# Patient Record
Sex: Male | Born: 1990 | Race: White | Hispanic: No | Smoking: Never smoker
Health system: Southern US, Community
[De-identification: ages and names within clinical notes are randomized; demographics above are authoritative.]

## PROBLEM LIST (undated history)

## (undated) DIAGNOSIS — G4730 Sleep apnea, unspecified: Secondary | ICD-10-CM

## (undated) DIAGNOSIS — K8071 Calculus of gallbladder and bile duct without cholecystitis with obstruction: Secondary | ICD-10-CM

## (undated) DIAGNOSIS — K37 Unspecified appendicitis: Secondary | ICD-10-CM

## (undated) DIAGNOSIS — R7989 Other specified abnormal findings of blood chemistry: Secondary | ICD-10-CM

## (undated) HISTORY — DX: Other specified abnormal findings of blood chemistry: R79.89

## (undated) HISTORY — PX: APPENDECTOMY: SHX54

## (undated) HISTORY — DX: Calculus of gallbladder and bile duct without cholecystitis with obstruction: K80.71

## (undated) HISTORY — PX: CHOLECYSTECTOMY: SHX55

---

## 2008-02-06 DIAGNOSIS — K8071 Calculus of gallbladder and bile duct without cholecystitis with obstruction: Secondary | ICD-10-CM

## 2008-02-06 HISTORY — PX: GALLBLADDER SURGERY: SHX652

## 2008-02-06 HISTORY — DX: Calculus of gallbladder and bile duct without cholecystitis with obstruction: K80.71

## 2014-05-13 ENCOUNTER — Ambulatory Visit (INDEPENDENT_AMBULATORY_CARE_PROVIDER_SITE_OTHER): Payer: BC Managed Care – PPO | Admitting: Physician Assistant

## 2014-05-13 ENCOUNTER — Encounter: Payer: Self-pay | Admitting: Physician Assistant

## 2014-05-13 VITALS — BP 126/96 | HR 97 | Temp 98.3°F | Resp 16 | Ht 72.5 in | Wt >= 6400 oz

## 2014-05-13 DIAGNOSIS — F32A Depression, unspecified: Secondary | ICD-10-CM

## 2014-05-13 DIAGNOSIS — H6981 Other specified disorders of Eustachian tube, right ear: Secondary | ICD-10-CM

## 2014-05-13 DIAGNOSIS — F419 Anxiety disorder, unspecified: Principal | ICD-10-CM

## 2014-05-13 DIAGNOSIS — H6980 Other specified disorders of Eustachian tube, unspecified ear: Secondary | ICD-10-CM

## 2014-05-13 DIAGNOSIS — N62 Hypertrophy of breast: Secondary | ICD-10-CM

## 2014-05-13 DIAGNOSIS — F329 Major depressive disorder, single episode, unspecified: Secondary | ICD-10-CM

## 2014-05-13 DIAGNOSIS — F341 Dysthymic disorder: Secondary | ICD-10-CM

## 2014-05-13 DIAGNOSIS — H6990 Unspecified Eustachian tube disorder, unspecified ear: Secondary | ICD-10-CM

## 2014-05-13 DIAGNOSIS — E291 Testicular hypofunction: Secondary | ICD-10-CM

## 2014-05-13 LAB — COMPREHENSIVE METABOLIC PANEL
ALBUMIN: 4.2 g/dL (ref 3.5–5.2)
ALK PHOS: 83 U/L (ref 39–117)
ALT: 39 U/L (ref 0–53)
AST: 25 U/L (ref 0–37)
BILIRUBIN TOTAL: 0.5 mg/dL (ref 0.2–1.2)
BUN: 13 mg/dL (ref 6–23)
CO2: 24 mEq/L (ref 19–32)
Calcium: 9.3 mg/dL (ref 8.4–10.5)
Chloride: 103 mEq/L (ref 96–112)
Creat: 0.92 mg/dL (ref 0.50–1.35)
GLUCOSE: 90 mg/dL (ref 70–99)
POTASSIUM: 4.6 meq/L (ref 3.5–5.3)
Sodium: 137 mEq/L (ref 135–145)
Total Protein: 7 g/dL (ref 6.0–8.3)

## 2014-05-13 LAB — TSH: TSH: 1.384 u[IU]/mL (ref 0.350–4.500)

## 2014-05-13 LAB — T4: T4, Total: 9 ug/dL (ref 5.0–12.5)

## 2014-05-13 MED ORDER — ESCITALOPRAM OXALATE 10 MG PO TABS: ORAL_TABLET | ORAL | Status: DC

## 2014-05-13 MED ORDER — FLUTICASONE PROPIONATE 50 MCG/ACT NA SUSP: 2.0000 | Freq: Every day | NASAL | Status: DC

## 2014-05-13 NOTE — Patient Instructions (Signed)
Please obtain labs.  I will call you with your results.  We will know more once we have labs. For anxiety and mood, please start taking Lexapro as instructed. Follow-up with me for this in 4-6 weeks.  If you develop any stomach upset or erectile dysfunction and this does not go away within a few days, please call the office.

## 2014-05-13 NOTE — Progress Notes (Signed)
Patient presents to clinic today to establish care.  Acute Concerns: Patient c/o pressure and popping sound in his right ear over the past 2 months.  Denies ear pain.  Endorses occasional rhinorrhea and seasonal allergy symptoms.  Denies tinnitus, decreased hearing or drainage from ear.  Patient also wishes to discuss gynecomastia.  Patient states that he was diagnosed with gynecomastia and low testosterone when he was in his late teens.  Was sent to a surgeon who did a workup.  Patient states he never received surgical or other treatment for symptoms.  Body mass index is 54.98 kg/(m^2).  Patient also states he has been having difficulty losing weight.  Is trying to increase daily exercise and to eat healthier.  Endorses mental sluggishness and loss of libido.  Denies hx of thyroid disorder, although he states this runs in his family.    Patient also c/o depressed mood and anxiety stemming from his self-image.  States this has been going on for several years.  Impacts quality of life. Denies panic attacks.  Denies suicidal ideation.   Chronic Issues: See Above concerning gynecomastia and hypogonadism.  Health Maintenance: Dental -- overdue  Vision -- overdue; has upcoming appointment.  Immunizations -- Tetanus 04/29/2014  Past Medical History  Diagnosis Date  . Gallbladder & bile duct stone with obstruction 05.2009    Past Surgical History  Procedure Laterality Date  . Gallbladder surgery  05.2009    No current outpatient prescriptions on file prior to visit.   No current facility-administered medications on file prior to visit.    Allergies  Allergen Reactions  . Morphine And Related     Burning in veins    Family History  Problem Relation Age of Onset  . Depression Mother     Living  . Hypertension Maternal Grandfather   . Stroke Maternal Grandfather   . Diverticulosis Father   . Ovarian cancer Paternal Grandmother   . Bipolar disorder Sister   . Cancer Sister    Deceased at Age 16  . Bipolar disorder Maternal Grandmother     History   Social History  . Marital Status: Single    Spouse Name: N/A    Number of Children: N/A  . Years of Education: N/A   Occupational History  . Not on file.   Social History Main Topics  . Smoking status: Never Smoker   . Smokeless tobacco: Never Used  . Alcohol Use: Yes     Comment: rare  . Drug Use: No  . Sexual Activity: Yes     Comment: women -- OCPs   Other Topics Concern  . Not on file   Social History Narrative  . No narrative on file   ROS See HPI.  All other ROS are negative.  BP 126/96  Pulse 97  Temp(Src) 98.3 F (36.8 C) (Oral)  Resp 16  Ht 6' 0.5" (1.842 m)  Wt 411 lb 4 oz (186.542 kg)  BMI 54.98 kg/m2  SpO2 97%  Physical Exam  Vitals reviewed. Constitutional: He is oriented to person, place, and time and well-developed, well-nourished, and in no distress.  HENT:  Head: Normocephalic and atraumatic.  Right Ear: External ear normal.  Left Ear: External ear normal.  Nose: Nose normal.  Mouth/Throat: Oropharynx is clear and moist. No oropharyngeal exudate.  Eyes: Conjunctivae are normal.  Neck: Neck supple. No thyromegaly present.  Cardiovascular: Normal rate, regular rhythm, normal heart sounds and intact distal pulses.   Pulmonary/Chest: Effort normal and breath sounds normal.  No respiratory distress. He has no wheezes. He has no rales. He exhibits no tenderness.  Bilateral gynecomastia noted.  Abdominal: Soft. Bowel sounds are normal. He exhibits no distension. There is no tenderness.  Lymphadenopathy:    He has no cervical adenopathy.  Neurological: He is alert and oriented to person, place, and time.  Skin: Skin is warm and dry. No rash noted.  Psychiatric: Affect normal.   Assessment/Plan: Gynecomastia Unclear etiology.  Patient endorses hypogonadism as cause.  Never had treatment. Body mass index is 54.98 kg/(m^2).  Denies marijuana use.  Is not on any medications  linked to symptom. Excess adipose tissue is likely contributing.  Will obtain workup to include Testosterone and Estrogen levels.  Anxiety and depression Will check thyroid function panel.  Low T could be contributing.  Definitely a body-image aspect to symptoms. Rx Lexapro.  Handout given on [employer] counselors.  Encouraged continue TLCs for weight loss.  Handout given on calorie counting and exercise. Follow-up in 1 month.  Eustachian tube dysfunction Daily Claritin.  Rx Flonase daily.   Hypogonadism in male Endorses by patient.  Unclear whether primary or secondary.  Will obtain records from previous PMD. Will obtain Testosterone, Estrogen, FSH and LH.  Will also obtain TSH and CMP. Will further evaluate and workup based on initial results.  Morbid obesity Discussed diet and exercise. Will check thyroid function. Handouts given.  Follow-up in 1 month.

## 2014-05-13 NOTE — Progress Notes (Signed)
Pre visit review using our clinic review tool, if applicable. No additional management support is needed unless otherwise documented below in the visit note/SLS  

## 2014-05-14 LAB — TESTOSTERONE, FREE, TOTAL, SHBG
Sex Hormone Binding: 20 nmol/L (ref 13–71)
TESTOSTERONE-% FREE: 2.5 % (ref 1.6–2.9)
TESTOSTERONE: 209 ng/dL — AB (ref 300–890)
Testosterone, Free: 51.7 pg/mL (ref 47.0–244.0)

## 2014-05-17 LAB — ESTROGENS, TOTAL: Estrogen: 189 pg/mL (ref 60–190)

## 2014-05-19 ENCOUNTER — Telehealth: Payer: Self-pay | Admitting: *Deleted

## 2014-05-19 DIAGNOSIS — R7989 Other specified abnormal findings of blood chemistry: Secondary | ICD-10-CM

## 2014-05-19 NOTE — Telephone Encounter (Signed)
Message copied by Kathi SimpersFERGERSON, Jocee Kissick A on Wed May 19, 2014  3:47 PM ------      Message from: Marcelline MatesMARTIN, WILLIAM      Created: Tue May 18, 2014  8:50 AM       Estrogen level is normal.  For the low testosterone, the level has to be repeated to confirm diagnosis (for insurance purposes) for insurance to pay for treatment.  We would either need to repeat the level in our lab, or we can wait for the records from his previous PCP which should have results from his prior workup of gynecomastia. We do need to distinguish whether or not the testosterone deficiency is stemming from his testicles (low testosterone output) or from his pituitary gland in the brain (decreased signaling to the testes for testosterone production).  I imagine this was done in his previous workup so we can either obtain these labs in our office [facility](LH, FSH, free/total testosterone) or wait until his records are in.  Once we have these values, we can start the right therapy -- likely testosterone replacement. Please let me know what patient wishes to do. ------

## 2014-05-19 NOTE — Telephone Encounter (Signed)
Notified pt of below recommendations. Pt wishes to complete additional testing in our lab and will return tomorrow to have these completed. Lab orders entered.

## 2014-05-20 ENCOUNTER — Telehealth: Payer: Self-pay | Admitting: Physician Assistant

## 2014-05-20 DIAGNOSIS — F329 Major depressive disorder, single episode, unspecified: Secondary | ICD-10-CM | POA: Insufficient documentation

## 2014-05-20 DIAGNOSIS — F32A Depression, unspecified: Secondary | ICD-10-CM | POA: Insufficient documentation

## 2014-05-20 DIAGNOSIS — E291 Testicular hypofunction: Secondary | ICD-10-CM

## 2014-05-20 DIAGNOSIS — F419 Anxiety disorder, unspecified: Principal | ICD-10-CM

## 2014-05-20 DIAGNOSIS — N62 Hypertrophy of breast: Secondary | ICD-10-CM

## 2014-05-20 DIAGNOSIS — H6990 Unspecified Eustachian tube disorder, unspecified ear: Secondary | ICD-10-CM

## 2014-05-20 DIAGNOSIS — H6980 Other specified disorders of Eustachian tube, unspecified ear: Secondary | ICD-10-CM | POA: Insufficient documentation

## 2014-05-20 HISTORY — DX: Hypertrophy of breast: N62

## 2014-05-20 HISTORY — DX: Unspecified eustachian tube disorder, unspecified ear: H69.90

## 2014-05-20 LAB — LUTEINIZING HORMONE: LH: 2 m[IU]/mL (ref 1.5–9.3)

## 2014-05-20 LAB — FOLLICLE STIMULATING HORMONE: FSH: 0.6 m[IU]/mL — ABNORMAL LOW (ref 1.4–18.1)

## 2014-05-20 NOTE — Assessment & Plan Note (Signed)
Discussed diet and exercise. Will check thyroid function. Handouts given.  Follow-up in 1 month.

## 2014-05-20 NOTE — Assessment & Plan Note (Addendum)
Will check thyroid function panel.  Low T could be contributing.  Definitely a body-image aspect to symptoms. Rx Lexapro.  Handout given on [employer] counselors.  Encouraged continue TLCs for weight loss.  Handout given on calorie counting and exercise. Follow-up in 1 month.

## 2014-05-20 NOTE — Assessment & Plan Note (Signed)
Daily Claritin.  Rx Flonase daily.

## 2014-05-20 NOTE — Telephone Encounter (Signed)
Results are in -- Testosterone level is still reduced.  His FSH is decreased and LH is at the low end of normal.  This makes me concerned that the testicles are not producing enough testosterone as a result of lack of hormone-signaling from his pituitary gland.  This could be due to a benign hormone secreting tumor called a prolactinoma. Low T may not be due to a testicular problem itself.  The next phase of evaluation would be to check a prolactin level and an MRI of the brain to assess his pituitary gland for a hormone-secreting tumor. We need to figure out the source of this deficiency before we decide on definitive treatment.  I am also willing to set him up with an Endocrinologist for a second opinion before proceeding with imaging if he wishes, but this is the standard course of workup for his hypogonadism.

## 2014-05-20 NOTE — Assessment & Plan Note (Signed)
Endorses by patient.  Unclear whether primary or secondary.  Will obtain records from previous PMD. Will obtain Testosterone, Estrogen, FSH and LH.  Will also obtain TSH and CMP. Will further evaluate and workup based on initial results.

## 2014-05-20 NOTE — Assessment & Plan Note (Signed)
Unclear etiology.  Patient endorses hypogonadism as cause.  Never had treatment. Body mass index is 54.98 kg/(m^2).  Denies marijuana use.  Is not on any medications linked to symptom. Excess adipose tissue is likely contributing.  Will obtain workup to include Testosterone and Estrogen levels.

## 2014-05-21 ENCOUNTER — Other Ambulatory Visit: Payer: Self-pay | Admitting: Physician Assistant

## 2014-05-21 DIAGNOSIS — E291 Testicular hypofunction: Secondary | ICD-10-CM

## 2014-05-21 LAB — TESTOSTERONE, FREE, TOTAL, SHBG
Sex Hormone Binding: 19 nmol/L (ref 13–71)
TESTOSTERONE: 171 ng/dL — AB (ref 300–890)
Testosterone, Free: 42.7 pg/mL — ABNORMAL LOW (ref 47.0–244.0)
Testosterone-% Free: 2.5 % (ref 1.6–2.9)

## 2014-05-21 NOTE — Telephone Encounter (Signed)
Attempted to contact patient at 9:34 AM on 8/14 but could not reach him.  Voicemail not set up.  Will attempt to reach again today at lunchtime.

## 2014-05-21 NOTE — Telephone Encounter (Signed)
Patient called back.  Spoke with patient concerning results.  He wishes to proceed with MRI and further labs.  [facility]

## 2014-05-22 ENCOUNTER — Ambulatory Visit (HOSPITAL_BASED_OUTPATIENT_CLINIC_OR_DEPARTMENT_OTHER): Payer: BC Managed Care – PPO

## 2014-05-22 ENCOUNTER — Ambulatory Visit (HOSPITAL_BASED_OUTPATIENT_CLINIC_OR_DEPARTMENT_OTHER): Admission: RE | Admit: 2014-05-22 | Payer: BC Managed Care – PPO | Source: Ambulatory Visit

## 2014-05-24 ENCOUNTER — Telehealth: Payer: Self-pay | Admitting: Physician Assistant

## 2014-05-24 ENCOUNTER — Other Ambulatory Visit: Payer: Self-pay | Admitting: Physician Assistant

## 2014-05-24 DIAGNOSIS — E291 Testicular hypofunction: Secondary | ICD-10-CM

## 2014-05-24 NOTE — Telephone Encounter (Signed)
Spoke with patient who wanted to discuss the workup again.  Has been reading on the internet and had some questions.  Questions answered.

## 2014-05-24 NOTE — Telephone Encounter (Signed)
Pt is still wanting to go ahead with MRI, I will get it authorized. [facility]

## 2014-05-25 LAB — PROLACTIN: Prolactin: 9.7 ng/mL (ref 2.1–17.1)

## 2014-05-26 ENCOUNTER — Ambulatory Visit (INDEPENDENT_AMBULATORY_CARE_PROVIDER_SITE_OTHER): Payer: BC Managed Care – PPO

## 2014-05-26 ENCOUNTER — Other Ambulatory Visit: Payer: BC Managed Care – PPO

## 2014-05-26 ENCOUNTER — Telehealth: Payer: Self-pay | Admitting: Physician Assistant

## 2014-05-26 DIAGNOSIS — E236 Other disorders of pituitary gland: Secondary | ICD-10-CM

## 2014-05-26 DIAGNOSIS — E291 Testicular hypofunction: Secondary | ICD-10-CM

## 2014-05-26 MED ORDER — GADOBENATE DIMEGLUMINE 529 MG/ML IV SOLN: 20.0000 mL | Freq: Once | INTRAVENOUS | Status: AC | PRN

## 2014-05-26 NOTE — Telephone Encounter (Signed)
MRI is negative for mass or lesion which is a good finding.  His Prolactin levels were normal.  At this point, since we have ruled out a pituitary cause of symptoms, we can initiate testosterone replacement therapy.  There are different ways to administer testosterone.  The most common is through a gel placed on the shoulders.  The other types are wither through skin patches, oral losenges or injections.  If he is willing, I would like to start him on the gel and have him come back in 1 month to reassess how he is feeling and his testosterone levels.  I also recommend increased exercise and low cholesterol diet.  He is not to begin smoking while on testosterone therapy as even though he is young, it increases risk of stroke and heart attack.

## 2014-05-28 MED ORDER — TESTOSTERONE 50 MG/5GM (1%) TD GEL: 5.0000 g | Freq: Every day | TRANSDERMAL | Status: DC

## 2014-05-28 NOTE — Telephone Encounter (Signed)
Rx sent to pharmacy   

## 2014-05-28 NOTE — Telephone Encounter (Signed)
Patient informed, understood & agreed; Ok to prooceed forward with Testosterone Gel/SLS

## 2014-06-02 ENCOUNTER — Telehealth: Payer: Self-pay | Admitting: *Deleted

## 2014-06-02 ENCOUNTER — Telehealth: Payer: Self-pay

## 2014-06-02 MED ORDER — TESTOSTERONE 50 MG/5GM (1%) TD GEL: 5.0000 g | Freq: Every day | TRANSDERMAL | Status: DC

## 2014-06-02 NOTE — Telephone Encounter (Signed)
Per Vo provider via telephone, Ok to change Rx to [employer] received], Rx sent to pharmacy; Patient informed, understood & agreed/SLS

## 2014-06-02 NOTE — Telephone Encounter (Signed)
Representative (didn't hear name) stated that" there was a little confusion on the PA for. 1 there was'nt a check mark beside the Androgel. 2. Office asked for generic but pt has to try androgel before he can try generic because it is restricted. She going to fill out the paperwork but we need to let pt know to use Androgel instead of generic." LDM

## 2014-06-02 NOTE — Telephone Encounter (Signed)
PA for Testosterone received; faxed to [facility] pharmacy at [phone]

## 2014-07-16 ENCOUNTER — Telehealth: Payer: Self-pay | Admitting: Physician Assistant

## 2014-07-16 MED ORDER — TESTOSTERONE 40.5 MG/2.5GM (1.62%) TD GEL: TRANSDERMAL | Status: DC

## 2014-07-16 NOTE — Telephone Encounter (Signed)
Please Advise

## 2014-07-16 NOTE — Telephone Encounter (Signed)
Rx request faxed to pharmacy/SLS  

## 2014-07-16 NOTE — Telephone Encounter (Signed)
Called [facility] pharmacy and was informed that they have the Androgel 1%; called [facility] and was informed that the !5 will be D/C at the end of 2015 & their supplier [[employer]] no longer has the 1% packet and/ot pump in stock/SLS Spoke with patient about this and fave him this information with the option of refilling the 1% and sending it to another pharmacy and/or changing prescription to the 1.62% and sending that over to his [facility]. Pt would like to go ahead and switch over to the 1.62% d/t having a savings card for that dosage. Please provide new Rx to be sent to patient's pharmacy. Thanks/SLS

## 2014-07-16 NOTE — Telephone Encounter (Signed)
Caller name: Cristal DeerChristopher  Relation to pt: self  Call back number: [phone] [facility] [phone]   Reason for call:  pharmacy has informed patient testosterone (ANDROGEL) 50 MG/5GM (1%) GEL has been D/C pt seeking another medication. Pt scheduled appointment for 07/19/14

## 2014-07-16 NOTE — Telephone Encounter (Signed)
Call downstairs to verify this.  I have not heard this.  If true, will select alternative therapy.

## 2014-07-19 ENCOUNTER — Encounter: Payer: Self-pay | Admitting: Physician Assistant

## 2014-07-19 ENCOUNTER — Ambulatory Visit (INDEPENDENT_AMBULATORY_CARE_PROVIDER_SITE_OTHER): Payer: BC Managed Care – PPO | Admitting: Physician Assistant

## 2014-07-19 VITALS — BP 119/86 | HR 113 | Temp 98.4°F | Resp 16 | Ht 72.5 in | Wt >= 6400 oz

## 2014-07-19 DIAGNOSIS — F418 Other specified anxiety disorders: Secondary | ICD-10-CM

## 2014-07-19 DIAGNOSIS — E291 Testicular hypofunction: Secondary | ICD-10-CM

## 2014-07-19 DIAGNOSIS — F329 Major depressive disorder, single episode, unspecified: Secondary | ICD-10-CM

## 2014-07-19 DIAGNOSIS — F419 Anxiety disorder, unspecified: Principal | ICD-10-CM

## 2014-07-19 DIAGNOSIS — F32A Depression, unspecified: Secondary | ICD-10-CM

## 2014-07-19 MED ORDER — ESCITALOPRAM OXALATE 20 MG PO TABS: 20.0000 mg | ORAL_TABLET | Freq: Every day | ORAL | Status: DC

## 2014-07-19 NOTE — Patient Instructions (Signed)
Increase Lexapro to 20 mg daily.  You can take 2 tablets of your current 10 mg prescription.  When you pick up the new 20 mg prescription, take 1 tablet daily. Follow-up in 3 months.  For Testosterone, continue the gel as directed.  Return to lab at the end of November for repeat Testosterone level.

## 2014-07-19 NOTE — Progress Notes (Signed)
Pre visit review using our clinic review tool, if applicable. No additional management support is needed unless otherwise documented below in the visit note/SLS  

## 2014-07-19 NOTE — Assessment & Plan Note (Signed)
Tolerating topical medication.  Will recheck testosterone level in November.

## 2014-07-19 NOTE — Progress Notes (Signed)
Patient presents to clinic today for follow-up of anxiety and depression.  Patient notes improvement in mood with 10 mg Lexapro daily.  Still having some difficult days where medication is not sufficient.  Denies SI/HI.   Patient is using his Testosterone as directed. Endorses improvement in energy levels.  Past Medical History  Diagnosis Date  . Gallbladder & bile duct stone with obstruction 05.2009    Current Outpatient Prescriptions on File Prior to Visit  Medication Sig Dispense Refill  . fluticasone (FLONASE) 50 MCG/ACT nasal spray Place 2 sprays into both nostrils daily.  16 g  2  . Testosterone 40.5 MG/2.5GM (1.62%) GEL Apply 1 pump to skin daily.  2.5 g  1   No current facility-administered medications on file prior to visit.    Allergies  Allergen Reactions  . Morphine And Related     Burning in veins    Family History  Problem Relation Age of Onset  . Depression Mother     Living  . Hypertension Maternal Grandfather   . Stroke Maternal Grandfather   . Diverticulosis Father   . Ovarian cancer Paternal Grandmother   . Bipolar disorder Sister   . Cancer Sister     Deceased at Age 346  . Bipolar disorder Maternal Grandmother     History   Social History  . Marital Status: Single    Spouse Name: N/A    Number of Children: N/A  . Years of Education: N/A   Social History Main Topics  . Smoking status: Never Smoker   . Smokeless tobacco: Never Used  . Alcohol Use: Yes     Comment: rare  . Drug Use: No  . Sexual Activity: Yes     Comment: women -- OCPs   Other Topics Concern  . None   Social History Narrative  . None    Review of Systems - See HPI.  All other ROS are negative.  BP 119/86  Pulse 113  Temp(Src) 98.4 F (36.9 C) (Oral)  Resp 16  Ht 6' 0.5" (1.842 m)  Wt 411 lb 4 oz (186.542 kg)  BMI 54.98 kg/m2  SpO2 99%  Physical Exam  Vitals reviewed. Constitutional: He is oriented to person, place, and time and well-developed,  well-nourished, and in no distress.  HENT:  Head: Normocephalic and atraumatic.  Cardiovascular: Normal rate, regular rhythm, normal heart sounds and intact distal pulses.   Pulmonary/Chest: Effort normal and breath sounds normal.  Neurological: He is alert and oriented to person, place, and time.  Skin: Skin is warm and dry. No rash noted.  Psychiatric: Affect normal.   Recent Results (from the past 2160 hour(s))  COMPREHENSIVE METABOLIC PANEL     Status: None   Collection Time    05/13/14 10:34 AM      Result Value Ref Range   Sodium 137  135 - 145 mEq/L   Potassium 4.6  3.5 - 5.3 mEq/L   Chloride 103  96 - 112 mEq/L   CO2 24  19 - 32 mEq/L   Glucose, Bld 90  70 - 99 mg/dL   BUN 13  6 - 23 mg/dL   Creat 1.610.92  0.960.50 - 0.451.35 mg/dL   Total Bilirubin 0.5  0.2 - 1.2 mg/dL   Alkaline Phosphatase 83  39 - 117 U/L   AST 25  0 - 37 U/L   ALT 39  0 - 53 U/L   Total Protein 7.0  6.0 - 8.3 g/dL   Albumin  4.2  3.5 - 5.2 g/dL   Calcium 9.3  8.4 - 16.1 mg/dL  TSH     Status: None   Collection Time    05/13/14 10:34 AM      Result Value Ref Range   TSH 1.384  0.350 - 4.500 uIU/mL  T4     Status: None   Collection Time    05/13/14 10:34 AM      Result Value Ref Range   T4, Total 9.0  5.0 - 12.5 ug/dL  TESTOSTERONE, FREE, TOTAL     Status: Abnormal   Collection Time    05/13/14 10:34 AM      Result Value Ref Range   Testosterone 209 (*) 300 - 890 ng/dL   Comment:           Tanner Stage       Male              Male                   I              < 30 ng/dL        < 10 ng/dL                   II             < 150 ng/dL       < 30 ng/dL                   III            100-320 ng/dL     < 35 ng/dL                   IV             200-970 ng/dL     09-60 ng/dL                   V/Adult        300-890 ng/dL     45-40 ng/dL         Sex Hormone Binding 20  13 - 71 nmol/L   Testosterone, Free 51.7  47.0 - 244.0 pg/mL   Comment:       The concentration of free testosterone is derived  from a mathematical     expression based on constants for the binding of testosterone to sex     hormone-binding globulin and albumin.   Testosterone-% Free 2.5  1.6 - 2.9 %  ESTROGENS, TOTAL     Status: None   Collection Time    05/13/14 10:34 AM      Result Value Ref Range   Estrogen 189  60 - 190 pg/mL  LUTEINIZING HORMONE     Status: None   Collection Time    05/19/14  9:15 AM      Result Value Ref Range   LH 2.0  1.5 - 9.3 mIU/mL   Comment: Reference Ranges:              Male:     20 - 70 Years           1.5 -  9.3 mIU/mL                           > 70 Years           3.1 -  34.6 mIU/mL              Male:   Follicular Phase        1.9 - 12.5 mIU/mL                        Midcycle                8.7 - 76.3 mIU/mL                        Luteal Phase            0.5 - 16.9 mIU/mL                        Post Menopausal        15.9 - 54.0 mIU/mL                        Pregnant                    <  1.5 mIU/mL                        Contraceptives          0.7 -  5.6 mIU/mL              Children:                             <  6.0 mIU/mL        FOLLICLE STIMULATING HORMONE     Status: Abnormal   Collection Time    05/19/14  9:15 AM      Result Value Ref Range   FSH 0.6 (*) 1.4 - 18.1 mIU/mL   Comment: Reference Ranges:              Male:                         1.4 -  18.1 mIU/mL              Male:   Follicular Phase    2.5 -  10.2 mIU/mL                        MidCycle Peak       3.4 -  33.4 mIU/mL                        Luteal Phase        1.5 -   9.1 mIU/mL                        Post Menopausal    23.0 - 116.3 mIU/mL                        Pregnant                <   0.3 mIU/mL  TESTOSTERONE, FREE, TOTAL     Status: Abnormal   Collection Time    05/19/14  9:15 AM      Result Value Ref Range   Testosterone 171 (*) 300 - 890 ng/dL   Comment:  Tanner Stage       Male              Male                   I              < 30 ng/dL        < 10 ng/dL                    II             < 150 ng/dL       < 30 ng/dL                   III            100-320 ng/dL     < 35 ng/dL                   IV             200-970 ng/dL     16-10 ng/dL                   V/Adult        300-890 ng/dL     96-04 ng/dL         Sex Hormone Binding 19  13 - 71 nmol/L   Testosterone, Free 42.7 (*) 47.0 - 244.0 pg/mL   Comment:       The concentration of free testosterone is derived from a mathematical     expression based on constants for the binding of testosterone to sex     hormone-binding globulin and albumin.   Testosterone-% Free 2.5  1.6 - 2.9 %  PROLACTIN     Status: None   Collection Time    05/24/14  4:30 PM      Result Value Ref Range   Prolactin 9.7  2.1 - 17.1 ng/mL   Comment:      Reference Ranges:                      Male:                       2.1 -  17.1 ng/ml                      Male:   Pregnant          9.7 - 208.5 ng/mL                                Non Pregnant      2.8 -  29.2 ng/mL                                Post Menopausal   1.8 -  20.3 ng/mL                           Assessment/Plan: Hypogonadism in male Tolerating topical medication.  Will recheck testosterone level in November.  Anxiety and depression Increase Lexapro to 20 mg daily.  Follow-up in 3 months.

## 2014-07-19 NOTE — Assessment & Plan Note (Signed)
Increase Lexapro to 20 mg daily.  Follow-up in 3 months.

## 2014-08-11 ENCOUNTER — Other Ambulatory Visit: Payer: Self-pay | Admitting: Physician Assistant

## 2014-08-11 NOTE — Telephone Encounter (Signed)
Rx denied refilled on (07/19/14).//AB/CMA

## 2014-09-06 ENCOUNTER — Other Ambulatory Visit: Payer: BC Managed Care – PPO

## 2014-10-18 ENCOUNTER — Ambulatory Visit: Payer: BC Managed Care – PPO | Admitting: Physician Assistant

## 2015-03-06 ENCOUNTER — Other Ambulatory Visit: Payer: Self-pay | Admitting: Physician Assistant

## 2015-03-08 MED ORDER — ESCITALOPRAM OXALATE 20 MG PO TABS: ORAL_TABLET | ORAL | Status: DC

## 2015-03-08 NOTE — Telephone Encounter (Signed)
Rx printed and faxed to the pharmacy.//AB/CMA 

## 2015-03-08 NOTE — Addendum Note (Signed)
Addended by: Verdie ShireBAYNES, Darriona Dehaas M on: 03/08/2015 09:03 AM   Modules accepted: Orders

## 2015-03-09 ENCOUNTER — Other Ambulatory Visit: Payer: Self-pay | Admitting: Physician Assistant

## 2015-03-31 ENCOUNTER — Other Ambulatory Visit: Payer: Self-pay | Admitting: Physician Assistant

## 2015-05-02 ENCOUNTER — Other Ambulatory Visit: Payer: Self-pay | Admitting: Physician Assistant

## 2015-06-16 ENCOUNTER — Other Ambulatory Visit: Payer: Self-pay | Admitting: Physician Assistant

## 2015-07-07 ENCOUNTER — Other Ambulatory Visit: Payer: Self-pay | Admitting: Physician Assistant

## 2015-07-07 MED ORDER — ESCITALOPRAM OXALATE 10 MG PO TABS: 10.0000 mg | ORAL_TABLET | Freq: Every day | ORAL | Status: DC

## 2016-04-07 ENCOUNTER — Encounter (HOSPITAL_COMMUNITY): Payer: Self-pay | Admitting: Emergency Medicine

## 2016-04-07 ENCOUNTER — Emergency Department (HOSPITAL_COMMUNITY)
Admission: EM | Admit: 2016-04-07 | Discharge: 2016-04-07 | Disposition: A | Discharge status: Home / Self Care | Payer: BLUE CROSS/BLUE SHIELD | Attending: Emergency Medicine | Admitting: Emergency Medicine

## 2016-04-07 ENCOUNTER — Emergency Department (HOSPITAL_COMMUNITY): Payer: BLUE CROSS/BLUE SHIELD

## 2016-04-07 DIAGNOSIS — Z79899 Other long term (current) drug therapy: Secondary | ICD-10-CM | POA: Diagnosis not present

## 2016-04-07 DIAGNOSIS — Z791 Long term (current) use of non-steroidal anti-inflammatories (NSAID): Secondary | ICD-10-CM | POA: Diagnosis not present

## 2016-04-07 DIAGNOSIS — R079 Chest pain, unspecified: Secondary | ICD-10-CM

## 2016-04-07 DIAGNOSIS — R091 Pleurisy: Secondary | ICD-10-CM | POA: Insufficient documentation

## 2016-04-07 DIAGNOSIS — R0789 Other chest pain: Secondary | ICD-10-CM | POA: Diagnosis present

## 2016-04-07 LAB — BASIC METABOLIC PANEL
Anion gap: 7 (ref 5–15)
BUN: 15 mg/dL (ref 6–20)
CALCIUM: 9.1 mg/dL (ref 8.9–10.3)
CO2: 25 mmol/L (ref 22–32)
CREATININE: 0.9 mg/dL (ref 0.61–1.24)
Chloride: 106 mmol/L (ref 101–111)
GFR calc Af Amer: >60 mL/min (ref >60)
GLUCOSE: 106 mg/dL — AB (ref 65–99)
Potassium: 4.3 mmol/L (ref 3.5–5.1)
Sodium: 138 mmol/L (ref 135–145)

## 2016-04-07 LAB — CBC
HCT: 43.4 % (ref 39.0–52.0)
Hemoglobin: 14.5 g/dL (ref 13.0–17.0)
MCH: 28.9 pg (ref 26.0–34.0)
MCHC: 33.4 g/dL (ref 30.0–36.0)
MCV: 86.6 fL (ref 78.0–100.0)
Platelets: 274 10*3/uL (ref 150–400)
RBC: 5.01 MIL/uL (ref 4.22–5.81)
RDW: 12.6 % (ref 11.5–15.5)
WBC: 11.4 10*3/uL — ABNORMAL HIGH (ref 4.0–10.5)

## 2016-04-07 LAB — I-STAT TROPONIN, ED: TROPONIN I, POC: 0 ng/mL (ref 0.00–0.08)

## 2016-04-07 MED ORDER — IBUPROFEN 800 MG PO TABS: 800.0000 mg | ORAL_TABLET | Freq: Three times a day (TID) | ORAL | Status: DC

## 2016-04-07 MED ORDER — HYDROCODONE-ACETAMINOPHEN 5-325 MG PO TABS: 2.0000 | ORAL_TABLET | ORAL | Status: DC | PRN

## 2016-04-07 NOTE — ED Notes (Addendum)
Pt states that this afternoon he began having gradual pressure in his left chest that radiates to his back. Pt denies lightheadedness, nausea, dizziness, and is not diaphoretic. Pt has strong bilateral pulses. Pt has clear lung sounds and has normal S1 and S2 heart sounds. Pt has strong bilateral pulses and good cap refill. Pt also describes the pain as "pulsing and throbbing". Pt denies recent illness and denies any recent stressors  Pt has no history of diabetes, but does have hypertension

## 2016-04-07 NOTE — Discharge Instructions (Signed)
Chest Wall Pain °Chest wall pain is pain in or around the bones and muscles of your chest. Sometimes, an injury causes this pain. Sometimes, the cause may not be known. This pain may take several weeks or longer to get better. °HOME CARE INSTRUCTIONS  °Pay attention to any changes in your symptoms. Take these actions to help with your pain:  °· Rest as told by your health care provider.   °· Avoid activities that cause pain. These include any activities that use your chest muscles or your abdominal and side muscles to lift heavy items.    °· If directed, apply ice to the painful area: °· Put ice in a plastic bag. °· Place a towel between your skin and the bag. °· Leave the ice on for 20 minutes, 2-3 times per day. °· Take over-the-counter and prescription medicines only as told by your health care provider. °· Do not use tobacco products, including cigarettes, chewing tobacco, and e-cigarettes. If you need help quitting, ask your health care provider. °· Keep all follow-up visits as told by your health care provider. This is important. °SEEK MEDICAL CARE IF: °· You have a fever. °· Your chest pain becomes worse. °· You have new symptoms. °SEEK IMMEDIATE MEDICAL CARE IF: °· You have nausea or vomiting. °· You feel sweaty or light-headed. °· You have a cough with phlegm (sputum) or you cough up blood. °· You develop shortness of breath. °  °This information is not intended to replace advice given to you by your health care provider. Make sure you discuss any questions you have with your health care provider. °  °Document Released: 09/24/2005 Document Revised: 06/15/2015 Document Reviewed: 12/20/2014 °Elsevier Interactive Patient Education ©2016 Elsevier Inc. ° °Pleurisy °Pleurisy is an inflammation and swelling of the lining of the lungs (pleura). Because of this inflammation, it hurts to breathe. It can be aggravated by coughing, laughing, or deep breathing. Pleurisy is often caused by an underlying infection or  disease.  °HOME CARE INSTRUCTIONS  °Monitor your pleurisy for any changes. The following actions may help to alleviate any discomfort you are experiencing: °· Medicine may help with pain. Only take over-the-counter or prescription medicines for pain, discomfort, or fever as directed by your health care provider. °· Only take antibiotic medicine as directed. Make sure to finish it even if you start to feel better. °SEEK MEDICAL CARE IF:  °· Your pain is not controlled with medicine or is increasing. °· You have an increase in pus-like (purulent) secretions brought up with coughing. °SEEK IMMEDIATE MEDICAL CARE IF:  °· You have blue or dark lips, fingernails, or toenails. °· You are coughing up blood. °· You have increased difficulty breathing. °· You have continuing pain unrelieved by medicine or pain lasting more than 1 week. °· You have pain that radiates into your neck, arms, or jaw. °· You develop increased shortness of breath or wheezing. °· You develop a fever, rash, vomiting, fainting, or other serious symptoms. °MAKE SURE YOU: °· Understand these instructions.   °· Will watch your condition.   °· Will get help right away if you are not doing well or get worse. °  °  °This information is not intended to replace advice given to you by your health care provider. Make sure you discuss any questions you have with your health care provider. °  °Document Released: 09/24/2005 Document Revised: 05/27/2013 Document Reviewed: 03/08/2013 °Elsevier Interactive Patient Education ©2016 Elsevier Inc. ° °

## 2016-04-07 NOTE — ED Provider Notes (Signed)
CSN: [id number]     Arrival date & time 04/07/16  0204 History  By signing my name below, I, Nicholas Long, attest that this documentation has been prepared under the direction and in the presence of Nicholas PorterMark Valyncia Wiens, MD. Electronically signed, Nicholas Long, ED Scribe. 04/07/2016. 2:32 AM.   Chief Complaint  Patient presents with  . Chest Pain   The history is provided by the patient. No language interpreter was used.   HPI Comments: Nicholas Long is a 25 y.o. male who presents to the Emergency Department complaining of waxing and waning left sided chest and shoulder discomfort that started this afternoon. Pt reports gradual chest pressure to his left chest that radiates to his back. Pt states that his chest discomfort is "more frequent" while taking a deep breath. Pt also complains of shortness of breath onset tonight. Pt states he was having difficulty sleeping tonight due to the pain. Pt states he took 800mg  ibuprofen roughly 3 hours ago with no relief.  Pt denies cough. Pt denies any injury.   Past Medical History  Diagnosis Date  . Gallbladder & bile duct stone with obstruction 05.2009   Past Surgical History  Procedure Laterality Date  . Gallbladder surgery  05.2009   Family History  Problem Relation Age of Onset  . Depression Mother     Living  . Hypertension Maternal Grandfather   . Stroke Maternal Grandfather   . Diverticulosis Father   . Ovarian cancer Paternal Grandmother   . Bipolar disorder Sister   . Cancer Sister     Deceased at Age 416  . Bipolar disorder Maternal Grandmother    Social History  Substance Use Topics  . Smoking status: Never Smoker   . Smokeless tobacco: Never Used  . Alcohol Use: Yes     Comment: rare    Review of Systems  Constitutional: Negative for fever, chills, diaphoresis, appetite change and fatigue.  HENT: Negative for mouth sores, sore throat and trouble swallowing.   Eyes: Negative for visual disturbance.  Respiratory: Positive for  shortness of breath. Negative for cough, chest tightness and wheezing.   Cardiovascular: Positive for chest pain (pressure).  Gastrointestinal: Negative for nausea, vomiting, abdominal pain, diarrhea and abdominal distention.  Endocrine: Negative for polydipsia, polyphagia and polyuria.  Genitourinary: Negative for dysuria, frequency and hematuria.  Musculoskeletal: Negative for gait problem.  Skin: Negative for color change, pallor and rash.  Neurological: Negative for dizziness, syncope, light-headedness and headaches.  Hematological: Does not bruise/bleed easily.  Psychiatric/Behavioral: Negative for behavioral problems and confusion.  All other systems reviewed and are negative.     Allergies  Morphine and related  Home Medications   Prior to Admission medications   Medication Sig Start Date End Date Taking? Authorizing Provider  escitalopram (LEXAPRO) 10 MG tablet Take 1 tablet (10 mg total) by mouth daily. 07/07/15   Waldon MerlWilliam C Martin, PA-C  fluticasone (FLONASE) 50 MCG/ACT nasal spray Place 2 sprays into both nostrils daily. 05/13/14   Waldon MerlWilliam C Martin, PA-C  loratadine (CLARITIN) 10 MG tablet Take 10 mg by mouth daily as needed for allergies.    Historical Provider, MD  Testosterone 40.5 MG/2.5GM (1.62%) GEL Apply 1 pump to skin daily. 07/16/14   Waldon MerlWilliam C Martin, PA-C   BP 135/104 mmHg  Pulse 103  Temp(Src) 98.8 F (37.1 C) (Oral)  Resp 20  Ht 6\' 1"  (1.854 m)  Wt 411 lb 8 oz (186.655 kg)  BMI 54.30 kg/m2  SpO2 98% Physical Exam  Constitutional:  He is oriented to person, place, and time. He appears well-developed and well-nourished. No distress.  HENT:  Head: Normocephalic.  Eyes: Conjunctivae are normal. Pupils are equal, round, and reactive to light. No scleral icterus.  Neck: Normal range of motion. Neck supple. No thyromegaly present.  Cardiovascular: Normal rate and regular rhythm.  Exam reveals no gallop and no friction rub.   No murmur heard. Pulmonary/Chest:  Effort normal and breath sounds normal. No respiratory distress. He has no wheezes. He has no rales.  Np plural or pericardial friction rubs  Abdominal: Soft. Bowel sounds are normal. He exhibits no distension. There is no tenderness. There is no rebound.  Musculoskeletal: Normal range of motion.  Neurological: He is alert and oriented to person, place, and time.  Skin: Skin is warm and dry. No rash noted.  Psychiatric: He has a normal mood and affect. His behavior is normal.    ED Course  Procedures  DIAGNOSTIC STUDIES: Oxygen Saturation is 98% on RA, normal by my interpretation.  COORDINATION OF CARE: 3:32 AM-Will order pain medication. Discussed treatment plan with pt at bedside and pt agreed to plan.   Labs Review Labs Reviewed  BASIC METABOLIC PANEL  CBC  I-STAT TROPOININ, ED    Imaging Review Dg Chest 2 View  04/07/2016  CLINICAL DATA:  25 year old male with chest pain EXAM: CHEST  2 VIEW COMPARISON:  None. FINDINGS: The heart size and mediastinal contours are within normal limits. Both lungs are clear. The visualized skeletal structures are unremarkable. IMPRESSION: No active cardiopulmonary disease. Electronically Signed   By: Elgie CollardArash  Radparvar M.D.   On: 04/07/2016 02:40   I have personally reviewed and evaluated these images and lab results as part of my medical decision-making.   EKG Interpretation   Date/Time:  Saturday April 07 2016 02:18:44 EDT Ventricular Rate:  97 PR Interval:    QRS Duration: 99 QT Interval:  337 QTC Calculation: 428 R Axis:   73 Text Interpretation:  Sinus rhythm  inferior Q wave III Confirmed by Nicholas FearingJAMES   MD, Nicholas Long ([id number]) on 04/07/2016 3:25:11 AM      MDM   Final diagnoses:  None    Chest x-ray shows no acute processes. No infiltrate. No pneumothorax. EKG shows isolated Q wave in lead V3. Is not tachycardic. No changes to suggest pericarditis. On exam he does not have pleural or pericardial friction rub. Plan is treatment for pleurisy.  He describes this quite eloquently. Discharge home NSAIDs when necessary Vicodin.    Nicholas PorterMark Carlina Derks, MD 04/07/16 [phone]

## 2017-09-02 ENCOUNTER — Ambulatory Visit (INDEPENDENT_AMBULATORY_CARE_PROVIDER_SITE_OTHER): Payer: Managed Care, Other (non HMO) | Admitting: Family Medicine

## 2017-09-02 ENCOUNTER — Encounter: Payer: Self-pay | Admitting: Family Medicine

## 2017-09-02 VITALS — BP 120/78 | HR 86 | Temp 98.5°F | Ht 73.2 in | Wt 386.0 lb

## 2017-09-02 DIAGNOSIS — H5789 Other specified disorders of eye and adnexa: Secondary | ICD-10-CM

## 2017-09-02 NOTE — Progress Notes (Signed)
Pre visit review using our clinic review tool, if applicable. No additional management support is needed unless otherwise documented below in the visit note. 

## 2017-09-02 NOTE — Progress Notes (Signed)
Chief Complaint  Patient presents with  . Eye Drainage    right eye    Nicholas FriarChristopher R Long is here for right eye irritation.  Duration: 10 days  No itching or pain.  No vision changes. Redness, drainage, sometimes will feel a grittiness in his eye. Initially started 4 years ago and will come intermittently.  Chemical exposure? No  Recent URI? No  Contact lenses? No  History of allergies? No  Treatment to date: Warm compresses, pink eye relief drops, ibuprofen  ROS:  Eyes: As noted above  Past Medical History:  Diagnosis Date  . Gallbladder & bile duct stone with obstruction 05.2009     BP 120/78 (BP Location: Left Arm, Patient Position: Sitting, Cuff Size: Large)   Pulse 86   Temp 98.5 F (36.9 C) (Oral)   Ht 6' 1.2" (1.859 m)   Wt (!) 386 lb (175.1 kg)   SpO2 96%   BMI 50.65 kg/m  Gen: Awake, alert, appears stated age Eyes: Lids clear, Sclera mildly injected on R, PERRLA, EOMi, no TTP to light touch over closed eyes, neg fluoro dye exam on R Nose: Nares patent without discharge Psych: Age appropriate judgment and insight; mood and affect normal  Eye drainage - Plan: Ambulatory referral to Ophthalmology  Artificial tears, cont warm compresses, refer to ophtho as this has been going on for many years. Dacryostenosis? Letter for work excusing for today given.  F/u prn.  Pt voiced understanding and agreement to the plan.  Jilda Rocheicholas Paul Ne[place] City[place], DO 09/02/17 1:22 PM

## 2017-09-02 NOTE — Patient Instructions (Signed)
Artificial tears like Refresh and Systane may be used for comfort. OK to get generic version. Generally people use them every 2-4 hours, but you can use them as much as you want because there is no medication in it.  If you do not hear anything about your referral in the next 1-2 weeks, call our office and ask for an update.

## 2018-05-01 ENCOUNTER — Other Ambulatory Visit: Payer: Self-pay

## 2018-05-01 ENCOUNTER — Ambulatory Visit (INDEPENDENT_AMBULATORY_CARE_PROVIDER_SITE_OTHER): Payer: Managed Care, Other (non HMO) | Admitting: Physician Assistant

## 2018-05-01 ENCOUNTER — Encounter: Payer: Self-pay | Admitting: Physician Assistant

## 2018-05-01 VITALS — BP 130/80 | HR 91 | Temp 98.0°F | Resp 16 | Ht 72.5 in | Wt 395.6 lb

## 2018-05-01 DIAGNOSIS — R5382 Chronic fatigue, unspecified: Secondary | ICD-10-CM

## 2018-05-01 DIAGNOSIS — R0681 Apnea, not elsewhere classified: Secondary | ICD-10-CM | POA: Diagnosis not present

## 2018-05-01 NOTE — Progress Notes (Signed)
Patient presents to clinic today to discuss symptoms of sleep apnea as recommended by his Ophthalmologist. Patient endorses snoring loudly with episodes of chocking in his sleep. Significant other has noted apneic episodes as well. Body mass index is 52.92 kg/m. Notes daytime somnolence despite feeling he gets restful sleep at night time. Denies any palpitations, SOB or dizziness.  Past Medical History:  Diagnosis Date  . Gallbladder & bile duct stone with obstruction 05.2009    Current Outpatient Medications on File Prior to Visit  Medication Sig Dispense Refill  . Hypromellose (SYSTANE OVERNIGHT THERAPY OP) Apply to eye. Apply a quarter of an inch to right eye at bedtime    . ibuprofen (ADVIL,MOTRIN) 800 MG tablet Take 1 tablet (800 mg total) by mouth 3 (three) times daily. 21 tablet 0   No current facility-administered medications on file prior to visit.     Allergies  Allergen Reactions  . Escitalopram     Made jittery and caused sharp pain in head  . Morphine And Related Other (See Comments)    Burning in veins    Family History  Problem Relation Age of Onset  . Depression Mother        Living  . Hypertension Maternal Grandfather   . Stroke Maternal Grandfather   . Diverticulosis Father   . Ovarian cancer Paternal Grandmother   . Bipolar disorder Sister   . Cancer Sister        Deceased at Age 446  . Bipolar disorder Maternal Grandmother     Social History   Socioeconomic History  . Marital status: Single    Spouse name: Not on file  . Number of children: Not on file  . Years of education: Not on file  . Highest education level: Not on file  Occupational History  . Not on file  Social Needs  . Financial resource strain: Not on file  . Food insecurity:    Worry: Not on file    Inability: Not on file  . Transportation needs:    Medical: Not on file    Non-medical: Not on file  Tobacco Use  . Smoking status: Never Smoker  . Smokeless tobacco: Never Used    Substance and Sexual Activity  . Alcohol use: Yes    Comment: rare  . Drug use: No  . Sexual activity: Yes    Comment: women -- OCPs  Lifestyle  . Physical activity:    Days per week: Not on file    Minutes per session: Not on file  . Stress: Not on file  Relationships  . Social connections:    Talks on phone: Not on file    Gets together: Not on file    Attends religious service: Not on file    Active member of club or organization: Not on file    Attends meetings of clubs or organizations: Not on file    Relationship status: Not on file  Other Topics Concern  . Not on file  Social History Narrative  . Not on file   Review of Systems - See HPI.  All other ROS are negative.  BP 130/80   Pulse 91   Temp 98 F (36.7 C) (Oral)   Resp 16   Ht 6' 0.5" (1.842 m)   Wt (!) 395 lb 9.6 oz (179.4 kg)   SpO2 96%   BMI 52.92 kg/m   Physical Exam  Constitutional: He is oriented to person, place, and time.  HENT:  Head:  Normocephalic and atraumatic.  Eyes: Conjunctivae are normal.  Neck: Neck supple.  Cardiovascular: Normal rate, regular rhythm and normal heart sounds.  Pulmonary/Chest: Effort normal and breath sounds normal. No stridor. No respiratory distress. He has no wheezes. He has no rales. He exhibits no tenderness.  Neurological: He is alert and oriented to person, place, and time.  Psychiatric: He has a normal mood and affect.    Assessment/Plan: 1. Apneic episode Body mass index is 52.92 kg/m. Will set up home sleep test for OSA. Discussed loss of 5-7% TBW can be very beneficial. He is going to work on this. Will schedule follow-up once results are in.   - Home sleep test  2. Chronic fatigue Likey due to OSA. Will also check AM testosterone, TSH and Vit D levels to further assess. Clean diet and exercise recommended.  - Testosterone; Future - Vitamin D (25 hydroxy); Future - TSH; Future   Piedad Climes, PA-C

## 2018-05-01 NOTE — Patient Instructions (Signed)
Please keep well-hydrated and get plenty of rest.  Try to keep working on diet and exercise. I am setting you up for a home sleep study.  I will call you with these results.   Please schedule a lab appointment between 8A and 9A to get am testosterone level and other labs drawn.

## 2018-05-06 ENCOUNTER — Other Ambulatory Visit (INDEPENDENT_AMBULATORY_CARE_PROVIDER_SITE_OTHER): Payer: Managed Care, Other (non HMO)

## 2018-05-06 DIAGNOSIS — R5382 Chronic fatigue, unspecified: Secondary | ICD-10-CM

## 2018-05-06 LAB — TESTOSTERONE: TESTOSTERONE: 148.22 ng/dL — AB (ref 300.00–890.00)

## 2018-05-06 LAB — TSH: TSH: 1.19 u[IU]/mL (ref 0.35–4.50)

## 2018-05-06 LAB — VITAMIN D 25 HYDROXY (VIT D DEFICIENCY, FRACTURES): VITD: 7.2 ng/mL — AB (ref 30.00–100.00)

## 2018-05-07 ENCOUNTER — Encounter: Payer: Self-pay | Admitting: Physician Assistant

## 2018-05-07 ENCOUNTER — Other Ambulatory Visit: Payer: Self-pay | Admitting: Physician Assistant

## 2018-05-07 DIAGNOSIS — E559 Vitamin D deficiency, unspecified: Secondary | ICD-10-CM

## 2018-05-07 DIAGNOSIS — R5382 Chronic fatigue, unspecified: Secondary | ICD-10-CM

## 2018-05-07 DIAGNOSIS — R7989 Other specified abnormal findings of blood chemistry: Secondary | ICD-10-CM

## 2018-05-07 MED ORDER — VITAMIN D (ERGOCALCIFEROL) 1.25 MG (50000 UNIT) PO CAPS: 50000.0000 [IU] | ORAL_CAPSULE | ORAL | 0 refills | Status: AC

## 2018-05-07 NOTE — Telephone Encounter (Signed)
Can you please check on the status of the home sleep study? Thank you.

## 2018-06-03 ENCOUNTER — Ambulatory Visit (HOSPITAL_BASED_OUTPATIENT_CLINIC_OR_DEPARTMENT_OTHER): Payer: Managed Care, Other (non HMO) | Attending: Physician Assistant | Admitting: Internal Medicine

## 2018-06-03 DIAGNOSIS — R5382 Chronic fatigue, unspecified: Secondary | ICD-10-CM | POA: Diagnosis present

## 2018-06-03 DIAGNOSIS — R0681 Apnea, not elsewhere classified: Secondary | ICD-10-CM | POA: Insufficient documentation

## 2018-06-03 DIAGNOSIS — G4733 Obstructive sleep apnea (adult) (pediatric): Secondary | ICD-10-CM | POA: Diagnosis not present

## 2018-06-12 DIAGNOSIS — R0681 Apnea, not elsewhere classified: Secondary | ICD-10-CM

## 2018-06-12 NOTE — Procedures (Signed)
    Patient Name: Nicholas Long, Nicholas Long Date: 06/03/2018 Gender: Male D.O.B: 09/11/1991 Age (years): 26 Referring Provider: Waldon Merl PA-C Height (inches): 73 Interpreting Physician: Jetty Duhamel MD, ABSM Weight (lbs): 395 RPSGT:  Sink BMI: 52 MRN: [id number] Neck Size: 20.00  CLINICAL INFORMATION Sleep Study Type: HST Indication for sleep study: snoring  Epworth Sleepiness Score:  16  SLEEP STUDY TECHNIQUE A multi-channel overnight portable sleep study was performed. The channels recorded were: nasal airflow, thoracic respiratory movement, and oxygen saturation with a pulse oximetry. Snoring was also monitored.  MEDICATIONS Patient self administered medications include: none reported.  SLEEP ARCHITECTURE Patient was studied for 435.5 minutes. The sleep efficiency was 99.4 % and the patient was supine for 68.2%. The arousal index was 0.0 per hour.  RESPIRATORY PARAMETERS The overall AHI was 17.1 per hour, with a central apnea index of 0.0 per hour.  The oxygen nadir was 88% during sleep.  CARDIAC DATA Mean heart rate during sleep was 72.5 bpm.  IMPRESSIONS - Moderate obstructive sleep apnea occurred during this study (AHI = 17.1/h). - No significant central sleep apnea occurred during this study (CAI = 0.0/h). - Mild oxygen desaturation was noted during this study (Min O2 = 88%). - Patient snored.  DIAGNOSIS - Obstructive Sleep Apnea (327.23 [G47.33 ICD-10])  RECOMMENDATIONS - Suggest CPAP titration sleep study or DME autopap. A fitted oral appliance or other options would be based on clinical judgment. - Be careful with alcohol, sedatives and other CNS depressants that may worsen sleep apnea and disrupt normal sleep architecture. - Sleep hygiene should be reviewed to assess factors that may improve sleep quality. - Weight management and regular exercise should be initiated or continued.  [Electronically signed] 06/12/2018 09:01  AM  Jetty Duhamel MD, ABSM Diplomate, American Board of Sleep Medicine   NPI: [id number]                         Jetty Duhamel Diplomate, American Board of Sleep Medicine  ELECTRONICALLY SIGNED ON:  06/12/2018, 8:58 AM [facility] PH: [phone]   FX: [phone] ACCREDITED BY THE AMERICAN ACADEMY OF SLEEP MEDICINE

## 2018-06-15 ENCOUNTER — Encounter: Payer: Self-pay | Admitting: Physician Assistant

## 2018-06-16 ENCOUNTER — Encounter: Payer: Self-pay | Admitting: Physician Assistant

## 2018-06-16 DIAGNOSIS — G4733 Obstructive sleep apnea (adult) (pediatric): Secondary | ICD-10-CM | POA: Insufficient documentation

## 2018-06-25 ENCOUNTER — Telehealth: Payer: Self-pay | Admitting: Emergency Medicine

## 2018-06-25 NOTE — Telephone Encounter (Signed)
[facility]

## 2018-06-25 NOTE — Telephone Encounter (Signed)
Copied from CRM [phone]#[id number]. Topic: General - Other >> Jun 25, 2018  4:06 PM Trula SladeWalter, Linda F wrote: Reason for CRM:  Tiffany w/[facility] [phone] is sent over a Certified Medical necessity form.  Ignore the first one it had the wrong dob on it.  The second form has the corrected dob.  The form is to be signed and dated and re-faxed by to them [phone].

## 2018-06-26 NOTE — Telephone Encounter (Signed)
Paperwork completed and faxed back.

## 2018-07-23 ENCOUNTER — Other Ambulatory Visit: Payer: Self-pay | Admitting: Physician Assistant

## 2018-07-23 ENCOUNTER — Encounter: Payer: Self-pay | Admitting: Physician Assistant

## 2018-07-23 DIAGNOSIS — E559 Vitamin D deficiency, unspecified: Secondary | ICD-10-CM

## 2019-01-07 ENCOUNTER — Other Ambulatory Visit: Payer: Self-pay

## 2019-01-07 ENCOUNTER — Encounter: Payer: Self-pay | Admitting: Physician Assistant

## 2019-01-07 ENCOUNTER — Ambulatory Visit (INDEPENDENT_AMBULATORY_CARE_PROVIDER_SITE_OTHER): Payer: BLUE CROSS/BLUE SHIELD | Admitting: Physician Assistant

## 2019-01-07 VITALS — BP 118/80 | HR 90 | Temp 98.4°F | Resp 16 | Ht 72.5 in | Wt >= 6400 oz

## 2019-01-07 DIAGNOSIS — J310 Chronic rhinitis: Secondary | ICD-10-CM

## 2019-01-07 DIAGNOSIS — R0989 Other specified symptoms and signs involving the circulatory and respiratory systems: Secondary | ICD-10-CM

## 2019-01-07 DIAGNOSIS — R591 Generalized enlarged lymph nodes: Secondary | ICD-10-CM

## 2019-01-07 MED ORDER — FLUTICASONE PROPIONATE 50 MCG/ACT NA SUSP: 2.0000 | Freq: Every day | NASAL | 0 refills | Status: DC

## 2019-01-07 NOTE — Patient Instructions (Signed)
Please keep hydrated and get plenty of rest. Start a saline nasal rinse 1-2 x daily. Use the Flonase nasal spray daily over the next week in the morning after saline nasal rinse.  Start over-the-counter Xyzal daily. Start the regular tubing again for your CPAP. Sour candy will help with salivary production.  Please let me know if things do not continue to improve over the next week!

## 2019-01-07 NOTE — Progress Notes (Signed)
Patient presents to clinic today c/o several weeks of rhinorrhea and PND not with 3 days of a tender "lump" on his throat. Denies fever, chills, malaise. Denies painful swallowing or difficulty swallowing. Denies change in voice. Notes some dryness in the morning but wears CPAP at night. HAs taken Sudafed which is not helping PND but making mouth very dry. Denies recent travel or sick contact. Notes the lump is less tender that it was a few days ago. Is not getting bigger in size per patient.  Past Medical History:  Diagnosis Date  . Gallbladder & bile duct stone with obstruction 05.2009    Current Outpatient Medications on File Prior to Visit  Medication Sig Dispense Refill  . Cholecalciferol ([employer] D3) 125 MCG (5000 UT) capsule Take 1 capsule by mouth daily.    . Hypromellose (SYSTANE OVERNIGHT THERAPY OP) Apply to eye. Apply a quarter of an inch to right eye at bedtime    . Melatonin 5 MG CHEW Chew 1 tablet by mouth at bedtime.    . Multiple Vitamins-Minerals (MEGA MULTI MEN) TBCR Take 1 tablet by mouth daily.    . Omega-3 Fatty Acids ([employer] FISH OIL) 1000 MG CAPS Take 1 capsule by mouth daily.     No current facility-administered medications on file prior to visit.     Allergies  Allergen Reactions  . Escitalopram     Made jittery and caused sharp pain in head  . Morphine And Related Other (See Comments)    Burning in veins    Family History  Problem Relation Age of Onset  . Depression Mother        Living  . Hypertension Maternal Grandfather   . Stroke Maternal Grandfather   . Diverticulosis Father   . Ovarian cancer Paternal Grandmother   . Bipolar disorder Sister   . Cancer Sister        Deceased at Age 46  . Bipolar disorder Maternal Grandmother     Social History   Socioeconomic History  . Marital status: Married    Spouse name: Not on file  . Number of children: Not on file  . Years of education: Not on file  . Highest education level: Not on file   Occupational History  . Not on file  Social Needs  . Financial resource strain: Not on file  . Food insecurity:    Worry: Not on file    Inability: Not on file  . Transportation needs:    Medical: Not on file    Non-medical: Not on file  Tobacco Use  . Smoking status: Never Smoker  . Smokeless tobacco: Never Used  Substance and Sexual Activity  . Alcohol use: Yes    Comment: rare  . Drug use: No  . Sexual activity: Yes    Comment: women -- OCPs  Lifestyle  . Physical activity:    Days per week: Not on file    Minutes per session: Not on file  . Stress: Not on file  Relationships  . Social connections:    Talks on phone: Not on file    Gets together: Not on file    Attends religious service: Not on file    Active member of club or organization: Not on file    Attends meetings of clubs or organizations: Not on file    Relationship status: Not on file  Other Topics Concern  . Not on file  Social History Narrative  . Not on file   Review  of Systems - See HPI.  All other ROS are negative.  BP 118/80   Pulse 90   Temp 98.4 F (36.9 C) (Oral)   Resp 16   Ht 6' 0.5" (1.842 m)   Wt (!) 400 lb (181.4 kg)   SpO2 98%   BMI 53.50 kg/m   Physical Exam Vitals signs reviewed.  Constitutional:      General: He is not in acute distress.    Appearance: He is obese. He is not ill-appearing or toxic-appearing.  HENT:     Head: Normocephalic and atraumatic.     Right Ear: Tympanic membrane normal.     Left Ear: Tympanic membrane normal.     Nose: Rhinorrhea present.     Mouth/Throat:     Mouth: Mucous membranes are moist.  Neck:     Musculoskeletal: Neck supple.  Lymphadenopathy:     Cervical: Cervical adenopathy (small 2 cm left-sided submental node palpable on examination) present.  Neurological:     Mental Status: He is alert.    Assessment/Plan: 1. Chronic rhinitis No current treatment. Start Daily Xyzal. Rx Flonase. Saline nasal rinse 1-2 x daily. Follow-up if  not improving.  2. Tenderness of lymph node Small 2 cm rubbery and mobile left-sided submental lymph node palpable on exam. Mildly tender on exam. Likely reactive to his chronic rhinitis that is worsened due to current environmental allergens. Supportive measures and OTC medications reviewed. This should continue to resolve as rhinitis improved with above treatment. Follow-up if not resolving as we will need labs at that point.   Piedad Climes, PA-C

## 2019-10-13 ENCOUNTER — Other Ambulatory Visit: Payer: Self-pay

## 2019-10-13 ENCOUNTER — Ambulatory Visit (INDEPENDENT_AMBULATORY_CARE_PROVIDER_SITE_OTHER): Payer: BC Managed Care – PPO | Admitting: Physician Assistant

## 2019-10-13 ENCOUNTER — Encounter: Payer: Self-pay | Admitting: Physician Assistant

## 2019-10-13 DIAGNOSIS — R202 Paresthesia of skin: Secondary | ICD-10-CM

## 2019-10-13 DIAGNOSIS — R1032 Left lower quadrant pain: Secondary | ICD-10-CM

## 2019-10-13 NOTE — Progress Notes (Signed)
I have discussed the procedure for the virtual visit with the patient who has given consent to proceed with assessment and treatment.   Cruzito Standre S Joie Hipps, CMA     

## 2019-10-13 NOTE — Progress Notes (Signed)
Virtual Visit via Video   I connected with patient on 10/13/19 at  8:00 AM EST by a video enabled telemedicine application and verified that I am speaking with the correct person using two identifiers.  Location patient: Home Location provider: [facility] Office Persons participating in the virtual visit: Patient, Provider, [place] (Patina Moore)  I discussed the limitations of evaluation and management by telemedicine and the availability of in person appointments. The patient expressed understanding and agreed to proceed.  Subjective:   HPI:   Patient presents via [facility] today with multiple somatic complaints noted over the past 2 weeks.  Patient endorses left-sided lower abdominal pain starting on Christmas.  Denies any noted trauma or injury or other inciting trigger for the symptoms.  States pain is more of an annoyance and mild discomfort than true pain.  Feels some pressure in the area.  Notes some associated left-sided lower back pain with this, but wonders if this is due to positioning.  Does note he has history of low back pain.  Last recorded weight of 400 pounds.  Patient denies heartburn, indigestion.  Denies any upper abdominal pain.  Denies fever, chills or vomiting.  Has had a couple of days of looser caliber stool.  Denies melena, hematochezia or tenesmus.  Denies any URI symptoms, recent travel or sick contact.  Did initially have some urinary hesitancy, frequency but this resolved with increase hydration.  Denies hematuria, dysuria or flank pain.  Denies history of kidney stone.  Patient also noting few days of numb and tingling sensation in legs and arm bilaterally with left greater than right.  Is concerned about potential for diabetes.  Has been trying to follow a better diet over the past few days.  ROS:   See pertinent positives and negatives per HPI.  Patient Active Problem List   Diagnosis Date Noted  . OSA (obstructive sleep apnea) 06/16/2018  . Anxiety  and depression 05/20/2014  . Gynecomastia 05/20/2014  . Hypogonadism in male 05/20/2014  . Eustachian tube dysfunction 05/20/2014  . Morbid obesity ([place]) 05/20/2014    Social History   Tobacco Use  . Smoking status: Never Smoker  . Smokeless tobacco: Never Used  Substance Use Topics  . Alcohol use: Yes    Comment: rare    Current Outpatient Medications:  .  Cholecalciferol ([employer]S D3) 125 MCG (5000 UT) capsule, Take 1 capsule by mouth daily., Disp: , Rfl:  .  Hypromellose (SYSTANE OVERNIGHT THERAPY OP), Apply to eye. Apply a quarter of an inch to right eye at bedtime, Disp: , Rfl:  .  Melatonin 5 MG CHEW, Chew 1 tablet by mouth at bedtime., Disp: , Rfl:  .  Multiple Vitamins-Minerals (MEGA MULTI MEN) TBCR, Take 1 tablet by mouth daily., Disp: , Rfl:  .  Omega-3 Fatty Acids (CVS FISH OIL) 1000 MG CAPS, Take 1 capsule by mouth daily., Disp: , Rfl:   Allergies  Allergen Reactions  . Escitalopram     Made jittery and caused sharp pain in head  . Morphine And Related Other (See Comments)    Burning in veins    Objective:   There were no vitals taken for this visit.  Patient is well-developed, well-nourished in no acute distress.  Resting comfortably. No labored breathing.  Speech is clear and coherent with logical contest.  Patient is alert and oriented at baseline.   Assessment and Plan:   1. Colicky LLQ abdominal pain Intermittent pain averaging 4 out of 10 when present.  Some residual pressure.  Unable to examine patient today as this is a video visit.  Patient unable to come into office today as well.  Patient to increase fluids and keep a bland diet.  Tylenol for pain.  Start a daily probiotic.  Patient scheduled for lab appointment tomorrow we will check UA, urine culture, CBC and CMP given variety of intermittent symptoms that he has had.  Hopefully this will give [place] better input into next steps.  May need CT.  Discussed with patient that since he cannot come in today, if  anything acutely worsens before appointment tomorrow he will need to be seen at urgent care or ER. - CBC w/Diff; Future - Comp Met (CMET); Future - Urinalysis, Routine w reflex microscopic; Future - Urine Culture; Future  2. Paresthesias New onset over the past few weeks.  Patient's last documented weight of 400 pounds so he is at increased risk for diabetes.  Will check A1c, CBC and B12 level at lab appointment tomorrow. - CBC w/Diff; Future - Hemoglobin A1c; Future - B12; Future    Leeanne Rio, PA-C 10/13/2019

## 2019-10-14 ENCOUNTER — Ambulatory Visit: Payer: Self-pay | Admitting: Physician Assistant

## 2019-10-14 ENCOUNTER — Ambulatory Visit (INDEPENDENT_AMBULATORY_CARE_PROVIDER_SITE_OTHER): Payer: BC Managed Care – PPO

## 2019-10-14 DIAGNOSIS — R202 Paresthesia of skin: Secondary | ICD-10-CM | POA: Diagnosis not present

## 2019-10-14 DIAGNOSIS — R1032 Left lower quadrant pain: Secondary | ICD-10-CM | POA: Diagnosis not present

## 2019-10-14 LAB — CBC WITH DIFFERENTIAL/PLATELET
Basophils Absolute: 0 10*3/uL (ref 0.0–0.1)
Basophils Relative: 0.5 % (ref 0.0–3.0)
Eosinophils Absolute: 0.2 10*3/uL (ref 0.0–0.7)
Eosinophils Relative: 1.9 % (ref 0.0–5.0)
HCT: 43.9 % (ref 39.0–52.0)
Hemoglobin: 14.6 g/dL (ref 13.0–17.0)
Lymphocytes Relative: 26.9 % (ref 12.0–46.0)
Lymphs Abs: 2.6 10*3/uL (ref 0.7–4.0)
MCHC: 33.3 g/dL (ref 30.0–36.0)
MCV: 87.3 fl (ref 78.0–100.0)
Monocytes Absolute: 0.6 10*3/uL (ref 0.1–1.0)
Monocytes Relative: 6.4 % (ref 3.0–12.0)
Neutro Abs: 6.1 10*3/uL (ref 1.4–7.7)
Neutrophils Relative %: 64.3 % (ref 43.0–77.0)
Platelets: 237 10*3/uL (ref 150.0–400.0)
RBC: 5.03 Mil/uL (ref 4.22–5.81)
RDW: 13 % (ref 11.5–15.5)
WBC: 9.5 10*3/uL (ref 4.0–10.5)

## 2019-10-14 LAB — URINALYSIS, ROUTINE W REFLEX MICROSCOPIC
Ketones, ur: 15 — AB
Leukocytes,Ua: NEGATIVE
Nitrite: NEGATIVE
RBC / HPF: NONE SEEN (ref 0–?)
Specific Gravity, Urine: >=1.03 — AB (ref 1.000–1.030)
Total Protein, Urine: NEGATIVE
Urine Glucose: NEGATIVE
Urobilinogen, UA: 0.2 (ref 0.0–1.0)
pH: 5.5 (ref 5.0–8.0)

## 2019-10-14 LAB — COMPREHENSIVE METABOLIC PANEL
ALT: 62 U/L — ABNORMAL HIGH (ref 0–53)
AST: 36 U/L (ref 0–37)
Albumin: 4.2 g/dL (ref 3.5–5.2)
Alkaline Phosphatase: 74 U/L (ref 39–117)
BUN: 14 mg/dL (ref 6–23)
CO2: 28 mEq/L (ref 19–32)
Calcium: 9.3 mg/dL (ref 8.4–10.5)
Chloride: 102 mEq/L (ref 96–112)
Creatinine, Ser: 0.8 mg/dL (ref 0.40–1.50)
GFR: 113.28 mL/min (ref >60.00)
Glucose, Bld: 78 mg/dL (ref 70–99)
Potassium: 4.3 mEq/L (ref 3.5–5.1)
Sodium: 138 mEq/L (ref 135–145)
Total Bilirubin: 0.6 mg/dL (ref 0.2–1.2)
Total Protein: 6.7 g/dL (ref 6.0–8.3)

## 2019-10-14 LAB — VITAMIN B12: Vitamin B-12: 198 pg/mL — ABNORMAL LOW (ref 211–911)

## 2019-10-14 LAB — HEMOGLOBIN A1C: Hgb A1c MFr Bld: 5.6 % (ref 4.6–6.5)

## 2019-10-14 NOTE — Addendum Note (Signed)
Addended by: Laddie Aquas A on: 10/14/2019 01:37 PM   Modules accepted: Orders

## 2019-10-16 ENCOUNTER — Encounter: Payer: Self-pay | Admitting: Emergency Medicine

## 2019-10-16 LAB — URINE CULTURE
MICRO NUMBER:: 10015624
Result:: NO GROWTH
SPECIMEN QUALITY:: ADEQUATE

## 2019-10-20 ENCOUNTER — Ambulatory Visit (INDEPENDENT_AMBULATORY_CARE_PROVIDER_SITE_OTHER): Payer: BC Managed Care – PPO | Admitting: Emergency Medicine

## 2019-10-20 ENCOUNTER — Other Ambulatory Visit: Payer: Self-pay

## 2019-10-20 DIAGNOSIS — E538 Deficiency of other specified B group vitamins: Secondary | ICD-10-CM

## 2019-10-20 MED ORDER — CYANOCOBALAMIN 1000 MCG/ML IJ SOLN
1000.0000 ug | Freq: Once | INTRAMUSCULAR | Status: AC
  Administered 2019-10-20: 1000 ug via INTRAMUSCULAR

## 2019-10-20 NOTE — Progress Notes (Signed)
Nicholas Long is a 29 y.o. male presents to the office today for B12  injections, per physician's orders. Original order: cyanocobalamin B 12 (med), 1,000 mcg(dose),  IM (route) was administered Left deltoid  (location) today. Patient tolerated injection. :  [name]

## 2019-10-20 NOTE — Progress Notes (Signed)
CMA note reviewed.   Azion Centrella Cody Leland Staszewski, PA-C  

## 2019-10-28 ENCOUNTER — Ambulatory Visit (INDEPENDENT_AMBULATORY_CARE_PROVIDER_SITE_OTHER): Payer: BC Managed Care – PPO

## 2019-10-28 DIAGNOSIS — E538 Deficiency of other specified B group vitamins: Secondary | ICD-10-CM

## 2019-10-28 MED ORDER — CYANOCOBALAMIN 1000 MCG/ML IJ SOLN
1000.0000 ug | Freq: Once | INTRAMUSCULAR | Status: AC
  Administered 2019-10-28: 1000 ug via INTRAMUSCULAR

## 2019-10-28 NOTE — Progress Notes (Signed)
Nicholas Long 29 y.o. male presents to office today for his 2nd B12 injection per Marcelline Mates, PA. Administered CYANOCOBALAMIN 1,000 mcg IM left arm. Patient tolerated well. Next nurse visit has been scheduled.

## 2019-11-03 NOTE — Progress Notes (Signed)
CMA note reviewed.   Amory Zbikowski Cody Antwion Carpenter, PA-C  

## 2019-11-04 ENCOUNTER — Other Ambulatory Visit: Payer: Self-pay

## 2019-11-04 ENCOUNTER — Ambulatory Visit (INDEPENDENT_AMBULATORY_CARE_PROVIDER_SITE_OTHER): Payer: BC Managed Care – PPO

## 2019-11-04 DIAGNOSIS — E538 Deficiency of other specified B group vitamins: Secondary | ICD-10-CM

## 2019-11-04 MED ORDER — CYANOCOBALAMIN 1000 MCG/ML IJ SOLN
1000.0000 ug | Freq: Once | INTRAMUSCULAR | Status: AC
  Administered 2019-11-04: 1000 ug via INTRAMUSCULAR

## 2019-11-04 NOTE — Progress Notes (Signed)
Nicholas Long, 29 year old male here for weekly Cyanocobalamin injection. This will be his 3rd out of 4 injection. Patient received CYANOCOBALAMIN 1,000 mcg/mL injection intramuscularly in the left deltoid. Patient tolerated well and was informed he needed to schedule another appointment for next week. He voiced understanding and left the office in good condition.

## 2019-11-11 ENCOUNTER — Other Ambulatory Visit: Payer: Self-pay

## 2019-11-11 ENCOUNTER — Telehealth: Payer: Self-pay

## 2019-11-11 ENCOUNTER — Ambulatory Visit (INDEPENDENT_AMBULATORY_CARE_PROVIDER_SITE_OTHER): Payer: BC Managed Care – PPO

## 2019-11-11 DIAGNOSIS — E538 Deficiency of other specified B group vitamins: Secondary | ICD-10-CM

## 2019-11-11 LAB — VITAMIN B12: Vitamin B-12: 347 pg/mL (ref 211–911)

## 2019-11-11 MED ORDER — CYANOCOBALAMIN 1000 MCG/ML IJ SOLN
1000.0000 ug | Freq: Once | INTRAMUSCULAR | Status: AC
  Administered 2019-11-11: 1000 ug via INTRAMUSCULAR

## 2019-11-11 NOTE — Progress Notes (Signed)
Acie Fredrickson 29 y.o. male presents to office today for final B12 injection per Marcelline Mates, PA. Administered CYANOCOBALAMIN 1,000 mcg IM left arm. Patient tolerated well.

## 2019-11-11 NOTE — Telephone Encounter (Signed)
B12 lab ordered.

## 2019-11-11 NOTE — Telephone Encounter (Signed)
Ok to check prior to injection.

## 2019-11-11 NOTE — Telephone Encounter (Signed)
Patient is scheduled for 4th B12 injection. Please advise if bloodwork needs to be drawn before last injection to recheck B12 levels.

## 2019-11-18 NOTE — Progress Notes (Signed)
CMA note reviewed.   Zayley Arras Cody Nakiesha Rumsey, PA-C  

## 2021-06-29 ENCOUNTER — Encounter: Payer: BC Managed Care – PPO | Admitting: Registered Nurse

## 2021-10-08 DIAGNOSIS — K37 Unspecified appendicitis: Secondary | ICD-10-CM

## 2021-10-08 DIAGNOSIS — Z8673 Personal history of transient ischemic attack (TIA), and cerebral infarction without residual deficits: Secondary | ICD-10-CM

## 2021-10-08 HISTORY — DX: Personal history of transient ischemic attack (TIA), and cerebral infarction without residual deficits: Z86.73

## 2021-10-08 HISTORY — DX: Unspecified appendicitis: K37

## 2021-12-24 ENCOUNTER — Other Ambulatory Visit: Payer: Self-pay

## 2021-12-24 DIAGNOSIS — K3532 Acute appendicitis with perforation and localized peritonitis, without abscess: Secondary | ICD-10-CM | POA: Diagnosis not present

## 2021-12-24 DIAGNOSIS — Z823 Family history of stroke: Secondary | ICD-10-CM

## 2021-12-24 DIAGNOSIS — Z885 Allergy status to narcotic agent status: Secondary | ICD-10-CM

## 2021-12-24 DIAGNOSIS — Z6841 Body Mass Index (BMI) 40.0 and over, adult: Secondary | ICD-10-CM

## 2021-12-24 DIAGNOSIS — Z8616 Personal history of COVID-19: Secondary | ICD-10-CM

## 2021-12-24 DIAGNOSIS — Z79899 Other long term (current) drug therapy: Secondary | ICD-10-CM

## 2021-12-24 DIAGNOSIS — R053 Chronic cough: Secondary | ICD-10-CM | POA: Diagnosis present

## 2021-12-24 DIAGNOSIS — R1033 Periumbilical pain: Secondary | ICD-10-CM | POA: Diagnosis not present

## 2021-12-24 DIAGNOSIS — G4733 Obstructive sleep apnea (adult) (pediatric): Secondary | ICD-10-CM | POA: Diagnosis present

## 2021-12-24 DIAGNOSIS — Z888 Allergy status to other drugs, medicaments and biological substances status: Secondary | ICD-10-CM

## 2021-12-24 DIAGNOSIS — Z8249 Family history of ischemic heart disease and other diseases of the circulatory system: Secondary | ICD-10-CM

## 2021-12-24 DIAGNOSIS — M545 Low back pain, unspecified: Secondary | ICD-10-CM | POA: Diagnosis present

## 2021-12-24 LAB — CBC
HCT: 45 % (ref 39.0–52.0)
Hemoglobin: 14.5 g/dL (ref 13.0–17.0)
MCH: 28.4 pg (ref 26.0–34.0)
MCHC: 32.2 g/dL (ref 30.0–36.0)
MCV: 88.2 fL (ref 80.0–100.0)
Platelets: 316 10*3/uL (ref 150–400)
RBC: 5.1 MIL/uL (ref 4.22–5.81)
RDW: 12.1 % (ref 11.5–15.5)
WBC: 14.7 10*3/uL — ABNORMAL HIGH (ref 4.0–10.5)
nRBC: 0 % (ref 0.0–0.2)

## 2021-12-24 LAB — COMPREHENSIVE METABOLIC PANEL
ALT: 24 U/L (ref 0–44)
AST: 18 U/L (ref 15–41)
Albumin: 3.6 g/dL (ref 3.5–5.0)
Alkaline Phosphatase: 81 U/L (ref 38–126)
Anion gap: 7 (ref 5–15)
BUN: 13 mg/dL (ref 6–20)
CO2: 26 mmol/L (ref 22–32)
Calcium: 9.1 mg/dL (ref 8.9–10.3)
Chloride: 106 mmol/L (ref 98–111)
Creatinine, Ser: 0.91 mg/dL (ref 0.61–1.24)
GFR, Estimated: >60 mL/min (ref >60)
Glucose, Bld: 101 mg/dL — ABNORMAL HIGH (ref 70–99)
Potassium: 4 mmol/L (ref 3.5–5.1)
Sodium: 139 mmol/L (ref 135–145)
Total Bilirubin: 0.3 mg/dL (ref 0.3–1.2)
Total Protein: 7.8 g/dL (ref 6.5–8.1)

## 2021-12-24 LAB — LIPASE, BLOOD: Lipase: 34 U/L (ref 11–51)

## 2021-12-24 NOTE — ED Notes (Signed)
Pt given urine cup, pt states he is unable to urinate at this time.  ?

## 2021-12-24 NOTE — ED Triage Notes (Signed)
Pt with c/o central abdominal pain for several weeks but pain increased Friday night. Pt denies N/V but has had some diarrhea. Pt states he has large scar from gall bladder surgery in 2009. Pt states he has not had GI issues from that surgery.  ?Pt denies urinary symptoms. Pt denies distention or bloating.  ? ?

## 2021-12-25 ENCOUNTER — Encounter: Payer: Self-pay | Admitting: Surgery

## 2021-12-25 ENCOUNTER — Emergency Department: Payer: BC Managed Care – PPO

## 2021-12-25 ENCOUNTER — Inpatient Hospital Stay
Admission: EM | Admit: 2021-12-25 | Discharge: 2021-12-29 | DRG: 372 | Disposition: A | Discharge status: Home / Self Care | Payer: BC Managed Care – PPO | Attending: Surgery | Admitting: Surgery

## 2021-12-25 DIAGNOSIS — K3533 Acute appendicitis with perforation and localized peritonitis, with abscess: Secondary | ICD-10-CM

## 2021-12-25 DIAGNOSIS — K3532 Acute appendicitis with perforation and localized peritonitis, without abscess: Secondary | ICD-10-CM | POA: Diagnosis present

## 2021-12-25 DIAGNOSIS — Z6841 Body Mass Index (BMI) 40.0 and over, adult: Secondary | ICD-10-CM | POA: Diagnosis not present

## 2021-12-25 DIAGNOSIS — Z8249 Family history of ischemic heart disease and other diseases of the circulatory system: Secondary | ICD-10-CM | POA: Diagnosis not present

## 2021-12-25 DIAGNOSIS — Z79899 Other long term (current) drug therapy: Secondary | ICD-10-CM | POA: Diagnosis not present

## 2021-12-25 DIAGNOSIS — Z885 Allergy status to narcotic agent status: Secondary | ICD-10-CM | POA: Diagnosis not present

## 2021-12-25 DIAGNOSIS — Z8616 Personal history of COVID-19: Secondary | ICD-10-CM | POA: Diagnosis not present

## 2021-12-25 DIAGNOSIS — Z823 Family history of stroke: Secondary | ICD-10-CM | POA: Diagnosis not present

## 2021-12-25 DIAGNOSIS — M545 Low back pain, unspecified: Secondary | ICD-10-CM | POA: Diagnosis present

## 2021-12-25 DIAGNOSIS — R1033 Periumbilical pain: Secondary | ICD-10-CM | POA: Diagnosis present

## 2021-12-25 DIAGNOSIS — G4733 Obstructive sleep apnea (adult) (pediatric): Secondary | ICD-10-CM | POA: Diagnosis present

## 2021-12-25 DIAGNOSIS — R053 Chronic cough: Secondary | ICD-10-CM | POA: Diagnosis present

## 2021-12-25 DIAGNOSIS — Z888 Allergy status to other drugs, medicaments and biological substances status: Secondary | ICD-10-CM | POA: Diagnosis not present

## 2021-12-25 LAB — CBC WITH DIFFERENTIAL/PLATELET
Abs Immature Granulocytes: 0.06 10*3/uL (ref 0.00–0.07)
Basophils Absolute: 0 10*3/uL (ref 0.0–0.1)
Basophils Relative: 0 %
Eosinophils Absolute: 0.2 10*3/uL (ref 0.0–0.5)
Eosinophils Relative: 2 %
HCT: 40.4 % (ref 39.0–52.0)
Hemoglobin: 13.1 g/dL (ref 13.0–17.0)
Immature Granulocytes: 1 %
Lymphocytes Relative: 23 %
Lymphs Abs: 2.6 10*3/uL (ref 0.7–4.0)
MCH: 28.5 pg (ref 26.0–34.0)
MCHC: 32.4 g/dL (ref 30.0–36.0)
MCV: 88 fL (ref 80.0–100.0)
Monocytes Absolute: 0.8 10*3/uL (ref 0.1–1.0)
Monocytes Relative: 7 %
Neutro Abs: 7.7 10*3/uL (ref 1.7–7.7)
Neutrophils Relative %: 67 %
Platelets: 255 10*3/uL (ref 150–400)
RBC: 4.59 MIL/uL (ref 4.22–5.81)
RDW: 12.3 % (ref 11.5–15.5)
WBC: 11.3 10*3/uL — ABNORMAL HIGH (ref 4.0–10.5)
nRBC: 0 % (ref 0.0–0.2)

## 2021-12-25 LAB — URINALYSIS, ROUTINE W REFLEX MICROSCOPIC
Bilirubin Urine: NEGATIVE
Glucose, UA: NEGATIVE mg/dL
Hgb urine dipstick: NEGATIVE
Ketones, ur: NEGATIVE mg/dL
Leukocytes,Ua: NEGATIVE
Nitrite: NEGATIVE
Protein, ur: NEGATIVE mg/dL
Specific Gravity, Urine: 1.026 (ref 1.005–1.030)
pH: 5 (ref 5.0–8.0)

## 2021-12-25 LAB — BASIC METABOLIC PANEL
Anion gap: 6 (ref 5–15)
BUN: 12 mg/dL (ref 6–20)
CO2: 26 mmol/L (ref 22–32)
Calcium: 8.3 mg/dL — ABNORMAL LOW (ref 8.9–10.3)
Chloride: 106 mmol/L (ref 98–111)
Creatinine, Ser: 0.75 mg/dL (ref 0.61–1.24)
GFR, Estimated: >60 mL/min (ref >60)
Glucose, Bld: 100 mg/dL — ABNORMAL HIGH (ref 70–99)
Potassium: 4.2 mmol/L (ref 3.5–5.1)
Sodium: 138 mmol/L (ref 135–145)

## 2021-12-25 LAB — MAGNESIUM: Magnesium: 2 mg/dL (ref 1.7–2.4)

## 2021-12-25 LAB — HIV ANTIBODY (ROUTINE TESTING W REFLEX): HIV Screen 4th Generation wRfx: NONREACTIVE

## 2021-12-25 MED ORDER — ONDANSETRON 4 MG PO TBDP
4.0000 mg | ORAL_TABLET | Freq: Four times a day (QID) | ORAL | Status: DC | PRN
  Administered 2021-12-26: 4 mg via ORAL
  Filled 2021-12-25: qty 1

## 2021-12-25 MED ORDER — FENTANYL CITRATE PF 50 MCG/ML IJ SOSY
50.0000 ug | PREFILLED_SYRINGE | Freq: Once | INTRAMUSCULAR | Status: AC
  Administered 2021-12-25: 50 ug via INTRAVENOUS
  Filled 2021-12-25: qty 1

## 2021-12-25 MED ORDER — SODIUM CHLORIDE 0.9 % IV BOLUS
1000.0000 mL | Freq: Once | INTRAVENOUS | Status: AC
  Administered 2021-12-25: 1000 mL via INTRAVENOUS

## 2021-12-25 MED ORDER — PIPERACILLIN-TAZOBACTAM 3.375 G IVPB 30 MIN
3.3750 g | Freq: Once | INTRAVENOUS | Status: AC
Start: 2021-12-25 — End: 2021-12-25
  Administered 2021-12-25: 3.375 g via INTRAVENOUS
  Filled 2021-12-25: qty 50

## 2021-12-25 MED ORDER — FENTANYL CITRATE PF 50 MCG/ML IJ SOSY
50.0000 ug | PREFILLED_SYRINGE | Freq: Once | INTRAMUSCULAR | Status: AC
Start: 2021-12-25 — End: 2021-12-25
  Administered 2021-12-25: 50 ug via INTRAVENOUS
  Filled 2021-12-25: qty 1

## 2021-12-25 MED ORDER — IOHEXOL 300 MG/ML  SOLN
100.0000 mL | Freq: Once | INTRAMUSCULAR | Status: AC | PRN
  Administered 2021-12-25: 100 mL via INTRAVENOUS

## 2021-12-25 MED ORDER — KETOROLAC TROMETHAMINE 30 MG/ML IJ SOLN
30.0000 mg | Freq: Four times a day (QID) | INTRAMUSCULAR | Status: DC
  Administered 2021-12-25 – 2021-12-29 (×17): 30 mg via INTRAVENOUS
  Filled 2021-12-25 (×17): qty 1

## 2021-12-25 MED ORDER — PIPERACILLIN-TAZOBACTAM 3.375 G IVPB
3.3750 g | Freq: Three times a day (TID) | INTRAVENOUS | Status: DC
  Administered 2021-12-25 – 2021-12-28 (×10): 3.375 g via INTRAVENOUS
  Filled 2021-12-25 (×10): qty 50

## 2021-12-25 MED ORDER — ONDANSETRON HCL 4 MG/2ML IJ SOLN: 4.0000 mg | Freq: Four times a day (QID) | INTRAMUSCULAR | Status: DC | PRN

## 2021-12-25 MED ORDER — PANTOPRAZOLE SODIUM 40 MG IV SOLR
40.0000 mg | Freq: Every day | INTRAVENOUS | Status: DC
  Administered 2021-12-25 – 2021-12-28 (×4): 40 mg via INTRAVENOUS
  Filled 2021-12-25 (×4): qty 10

## 2021-12-25 MED ORDER — ONDANSETRON HCL 4 MG/2ML IJ SOLN
4.0000 mg | Freq: Once | INTRAMUSCULAR | Status: AC
  Administered 2021-12-25: 4 mg via INTRAVENOUS
  Filled 2021-12-25: qty 2

## 2021-12-25 MED ORDER — HYDROMORPHONE HCL 1 MG/ML IJ SOLN
0.5000 mg | INTRAMUSCULAR | Status: DC | PRN
  Administered 2021-12-25 – 2021-12-28 (×5): 0.5 mg via INTRAVENOUS
  Filled 2021-12-25: qty 0.5
  Filled 2021-12-25: qty 1
  Filled 2021-12-25 (×4): qty 0.5

## 2021-12-25 MED ORDER — LACTATED RINGERS IV SOLN
125.0000 mL/h | INTRAVENOUS | Status: DC
  Administered 2021-12-25 – 2021-12-26 (×4): 125 mL/h via INTRAVENOUS

## 2021-12-25 NOTE — ED Notes (Signed)
Pt ambulatory to restroom

## 2021-12-25 NOTE — ED Notes (Signed)
Pt NAD in bed, a/ox4. Pt states he has been having intermittent diffuse ABD pain x weeks but 3 nights ago it became markedly worse with increasing right sided pain. Pt denies n/v/d or fever. Currently 2/10 intermittent pain made worse by palpation on right side. ?

## 2021-12-25 NOTE — Progress Notes (Signed)
Admission profile updated. ?

## 2021-12-25 NOTE — ED Provider Notes (Signed)
? ?[facility] ?Provider Note ? ? ? Event Date/Time  ? First MD Initiated Contact with Patient 12/25/21 0123   ?  (approximate) ? ? ?History  ? ?Abdominal Pain ? ? ?HPI ? ?Nicholas Long is a 31 y.o. male who presents to the ED from home with a chief complaint of abdominal pain.  Patient reports periumbilical abdominal pain for the past several weeks but pain increased 3 nights ago.  Reports aching type pain which is nonradiating and not associated with nausea or vomiting.  Has had some loose stools.  No personal history of IBD.  History of cholecystectomy.  Denies fever, chest pain, shortness of breath, dysuria.  Chronic cough since having COVID-19 in July.  Denies recent travel or antibiotic use. ?  ? ? ?Past Medical History  ? ?Past Medical History:  ?Diagnosis Date  ? Gallbladder & bile duct stone with obstruction 05.2009  ? ? ? ?Active Problem List  ? ?Patient Active Problem List  ? Diagnosis Date Noted  ? Acute perforated appendicitis 12/25/2021  ? OSA (obstructive sleep apnea) 06/16/2018  ? Anxiety and depression 05/20/2014  ? Gynecomastia 05/20/2014  ? Hypogonadism in male 05/20/2014  ? Eustachian tube dysfunction 05/20/2014  ? Morbid obesity ([facility]) 05/20/2014  ? ? ? ?Past Surgical History  ? ?Past Surgical History:  ?Procedure Laterality Date  ? GALLBLADDER SURGERY  05.2009  ? ? ? ?Home Medications  ? ?Prior to Admission medications   ?Medication Sig Start Date End Date Taking? Authorizing Provider  ?Cholecalciferol (CVS D3) 125 MCG (5000 UT) capsule Take 1 capsule by mouth daily. 12/07/18   [provider]  ?Hypromellose (SYSTANE OVERNIGHT THERAPY OP) Apply to eye. Apply a quarter of an inch to right eye at bedtime    [provider]  ?Melatonin 5 MG CHEW Chew 1 tablet by mouth at bedtime. 10/08/18   [provider]  ?Multiple Vitamins-Minerals (MEGA MULTI MEN) TBCR Take 1 tablet by mouth daily. 05/08/18   [provider]  ?Omega-3 Fatty Acids (CVS  FISH OIL) 1000 MG CAPS Take 1 capsule by mouth daily. 12/07/18   [provider]  ? ? ? ?Allergies  ?Escitalopram and Morphine and related ? ? ?Family History  ? ?Family History  ?Problem Relation Age of Onset  ? Depression Mother   ?     Living  ? Hypertension Maternal Grandfather   ? Stroke Maternal Grandfather   ? Diverticulosis Father   ? Ovarian cancer Paternal Grandmother   ? Bipolar disorder Sister   ? Cancer Sister   ?     Deceased at Age 526  ? Bipolar disorder Maternal Grandmother   ? ? ? ?Physical Exam  ?Triage Vital Signs: ?ED Triage Vitals  ?Enc Vitals Group  ?   BP 12/24/21 2258 126/78  ?   Pulse Rate 12/24/21 2258 87  ?   Resp 12/24/21 2258 18  ?   Temp 12/24/21 2258 98.5 ?F (36.9 ?C)  ?   Temp Source 12/24/21 2258 Oral  ?   SpO2 12/24/21 2258 97 %  ?   Weight 12/24/21 2259 (!) 385 lb (174.6 kg)  ?   Height 12/24/21 2259 6\' 1"  (1.854 m)  ?   Head Circumference --   ?   Peak Flow --   ?   Pain Score 12/24/21 2259 7  ?   Pain Loc --   ?   Pain Edu? --   ?   Excl. in GC? --   ? ? ?  Updated Vital Signs: ?BP 117/62 (BP Location: Right Arm)   Pulse 70   Temp 98.5 ?F (36.9 ?C) (Oral)   Resp 16   Ht 6\' 1"  (1.854 m)   Wt (!) 174.6 kg   SpO2 98%   BMI 50.79 kg/m?  ? ? ?General: Awake, no distress.  ?CV:  RRR.  Good peripheral perfusion.  ?Resp:  Normal effort.  CTA B. ?Abd:  Mild diffuse tenderness to palpation without rebound or guarding.  No distention.  ?Other:  No vesicles. ? ? ?ED Results / Procedures / Treatments  ?Labs ?(all labs ordered are listed, but only abnormal results are displayed) ?Labs Reviewed  ?COMPREHENSIVE METABOLIC PANEL - Abnormal; Notable for the following components:  ?    Result Value  ? Glucose, Bld 101 (*)   ? All other components within normal limits  ?CBC - Abnormal; Notable for the following components:  ? WBC 14.7 (*)   ? All other components within normal limits  ?URINALYSIS, ROUTINE W REFLEX MICROSCOPIC - Abnormal; Notable for the following components:  ? Color,  Urine YELLOW (*)   ? APPearance HAZY (*)   ? All other components within normal limits  ?LIPASE, BLOOD  ?HIV ANTIBODY (ROUTINE TESTING W REFLEX)  ?BASIC METABOLIC PANEL  ?MAGNESIUM  ?CBC WITH DIFFERENTIAL/PLATELET  ? ? ? ?EKG ? ?ED ECG REPORT ?I, , the attending physician, personally viewed and interpreted this ECG. ? ? Date: 12/25/2021 ? EKG Time: 2306 ? Rate: 99 ? Rhythm: normal sinus rhythm ? Axis: Normal ? Intervals:none ? ST&T Change: Nonspecific ? ? ? ?RADIOLOGY ?I have independently visualized and reviewed patient's CT scan as well as noted the radiology interpretation: ? ?CT abdomen/pelvis: ruptured appendicitis ? ?Official radiology report(s): ?CT Abdomen Pelvis W Contrast ? ?Result Date: 12/25/2021 ?CLINICAL DATA:  Abdominal pain, acute, nonlocalized.  Diarrhea. EXAM: CT ABDOMEN AND PELVIS WITH CONTRAST TECHNIQUE: Multidetector CT imaging of the abdomen and pelvis was performed using the standard protocol following bolus administration of intravenous contrast. RADIATION DOSE REDUCTION: This exam was performed according to the departmental dose-optimization program which includes automated exposure control, adjustment of the mA and/or kV according to patient size and/or use of iterative reconstruction technique. CONTRAST:  12/27/2021 OMNIPAQUE IOHEXOL 300 MG/ML  SOLN COMPARISON:  None. FINDINGS: Lower chest: No acute abnormality. Hepatobiliary: No focal liver abnormality is seen. Status post cholecystectomy. No biliary dilatation. Pancreas: Unremarkable Spleen: Unremarkable Adrenals/Urinary Tract: Adrenal glands are unremarkable. Kidneys are normal, without renal calculi, focal lesion, or hydronephrosis. Bladder is unremarkable. Stomach/Bowel: There is extensive inflammatory stranding immediately posterior to the cecum with heterogeneous enhancement in this location. The appendix is not clearly identified and, together, the findings suggest changes of a ruptured appendicitis. No discrete drainable  fluid collection is identified. The inflammatory mass measures roughly 4.6 x 5.0 x 6.0 cm on sagittal image # 44 and axial image # 84. No free intraperitoneal gas or fluid. Shotty adenopathy within the ileocolic mesentery without frankly pathologic enlargement, likely reactive. The stomach, small bowel, and large bowel are otherwise unremarkable. Vascular/Lymphatic: No significant vascular findings are present. No enlarged abdominal or pelvic lymph nodes. Reproductive: Prostate is unremarkable. Other: No abdominal wall hernia.  Rectum unremarkable. Musculoskeletal: No acute or significant osseous findings. IMPRESSION: 6 cm inflammatory mass immediately posterior to the cecum with nonvisualization of the appendix. Together, the findings are most suggestive of ruptured appendicitis with development of inflammatory phlegmon. No discrete drainable fluid collection identified. No obstruction. Electronically Signed   By:  M.D.   On: 12/25/2021 02:54   ? ? ?PROCEDURES: ? ?Critical Care performed: No ? ?Procedures ? ? ?MEDICATIONS ORDERED IN ED: ?Medications  ?lactated ringers infusion (125 mL/hr Intravenous New Bag/Given 12/25/21 0353)  ?ketorolac (TORADOL) 30 MG/ML injection 30 mg (has no administration in time range)  ?HYDROmorphone (DILAUDID) injection 0.5 mg (has no administration in time range)  ?ondansetron (ZOFRAN-ODT) disintegrating tablet 4 mg (has no administration in time range)  ?  Or  ?ondansetron (ZOFRAN) injection 4 mg (has no administration in time range)  ?pantoprazole (PROTONIX) injection 40 mg (has no administration in time range)  ?piperacillin-tazobactam (ZOSYN) IVPB 3.375 g (has no administration in time range)  ?fentaNYL (SUBLIMAZE) injection 50 mcg (50 mcg Intravenous Given 12/25/21 0201)  ?ondansetron [facility]) injection 4 mg (4 mg Intravenous Given 12/25/21 0159)  ?sodium chloride 0.9 % bolus 1,000 mL (0 mLs Intravenous Stopped 12/25/21 0340)  ?iohexol (OMNIPAQUE) 300 MG/ML solution 100  mL (100 mLs Intravenous Contrast Given 12/25/21 0215)  ?piperacillin-tazobactam (ZOSYN) IVPB 3.375 g (0 g Intravenous Stopped 12/25/21 0348)  ?fentaNYL (SUBLIMAZE) injection 50 mcg (50 mcg Intravenous Given 12/25/21 0341

## 2021-12-25 NOTE — H&P (Signed)
?Date of Admission:  12/25/2021 ? ?Reason for Admission:  Acute perforated appendicitis ? ?History of Present Illness: ?Nicholas Long is a 31 y.o. male presenting with a 2 week history of right lower quadrant abdominal pain.  He reports that the pain started two weeks ago but did not pay as much attention to it as he's been very busy traveling for work.  Unclear if it was getting worse in those two weeks, but for sure he has noted worsening over the past 3 days since Friday 3/17.  He denies fevers or chills, chest pain, shortness of breath, nausea.  He reports he had one episode of emesis but is related to a chronic cough that he's had since he had COVID.  He also reports diarrhea x 1 after taking metamucil when the pain initially started.  Has not been as hungry recently either.  He also reports some right lower back pain. ? ?In the ED, his labwork was significant for an elevated WBC of 14.7 and otherwise unremarkable labs.  He had a CT scan of abdomen/pelvis which I have personally viewed.  It shows acute appendicitis with a 6 cm phlegmon in the RLQ.  The appendix itself is hard to visualize within this phlegmon.  There is no abscess or free air. ? ?Past Medical History: ?Past Medical History:  ?Diagnosis Date  ? Gallbladder & bile duct stone with obstruction 05.2009  ?  ? ?Past Surgical History: ?Past Surgical History:  ?Procedure Laterality Date  ? GALLBLADDER SURGERY  05.2009  ? ? ?Home Medications: ?Prior to Admission medications   ?Medication Sig Start Date End Date Taking? Authorizing Provider  ?Cholecalciferol (CVS D3) 125 MCG (5000 UT) capsule Take 1 capsule by mouth daily. 12/07/18   [provider]  ?Hypromellose (SYSTANE OVERNIGHT THERAPY OP) Apply to eye. Apply a quarter of an inch to right eye at bedtime    [provider]  ?Melatonin 5 MG CHEW Chew 1 tablet by mouth at bedtime. 10/08/18   [provider]  ?Multiple Vitamins-Minerals (MEGA MULTI MEN) TBCR Take 1 tablet by  mouth daily. 05/08/18   [provider]  ?Omega-3 Fatty Acids (CVS FISH OIL) 1000 MG CAPS Take 1 capsule by mouth daily. 12/07/18   [provider]  ? ? ?Allergies: ?Allergies  ?Allergen Reactions  ? Escitalopram   ?  Made jittery and caused sharp pain in head  ? Morphine And Related Other (See Comments)  ?  Burning in veins  ? ? ?Social History: ? reports that he has never smoked. He has never used smokeless tobacco. He reports current alcohol use. He reports that he does not use drugs.  ? ?Family History: ?Family History  ?Problem Relation Age of Onset  ? Depression Mother   ?     Living  ? Hypertension Maternal Grandfather   ? Stroke Maternal Grandfather   ? Diverticulosis Father   ? Ovarian cancer Paternal Grandmother   ? Bipolar disorder Sister   ? Cancer Sister   ?     Deceased at Age 32  ? Bipolar disorder Maternal Grandmother   ? ? ?Review of Systems: ?Review of Systems  ?Constitutional:  Negative for chills and fever.  ?HENT:  Negative for hearing loss.   ?Respiratory:  Negative for shortness of breath.   ?Cardiovascular:  Negative for chest pain.  ?Gastrointestinal:  Positive for abdominal pain, diarrhea (x 1 episode) and vomiting (x 1 episode). Negative for constipation and nausea.  ?Genitourinary:  Negative for dysuria.  ?  Musculoskeletal:  Positive for back pain. Negative for myalgias.  ?Skin:  Negative for rash.  ?Neurological:  Negative for dizziness.  ?Psychiatric/Behavioral:  Negative for depression.   ? ?Physical Exam ?BP 117/62 (BP Location: Right Arm)   Pulse 70   Temp 98.5 ?F (36.9 ?C) (Oral)   Resp 16   Ht 6\' 1"  (1.854 m)   Wt (!) 174.6 kg   SpO2 98%   BMI 50.79 kg/m?  ?CONSTITUTIONAL: No acute distress. ?HEENT:  Normocephalic, atraumatic, extraocular motion intact. ?NECK: Trachea is midline, and there is no jugular venous distension.  ?RESPIRATORY:  Normal respiratory effort without pathologic use of accessory muscles. ?CARDIOVASCULAR: Regular rhythm and rate. ?GI: The  abdomen is soft, non-distended, with some tenderness to palpation in the right lower quadrant.  No diffuse peritonitis. Has RUQ subcostal scar well healed from prior open cholecystectomy. ?MUSCULOSKELETAL:  Normal muscle strength and tone in all four extremities.  No peripheral edema or cyanosis. ?SKIN: Skin turgor is normal. There are no pathologic skin lesions.  ?NEUROLOGIC:  Motor and sensation is grossly normal.  Cranial nerves are grossly intact. ?PSYCH:  Alert and oriented to person, place and time. Affect is normal. ? ?Laboratory Analysis: ?Results for orders placed or performed during the hospital encounter of 12/25/21 (from the past 24 hour(s))  ?Lipase, blood     Status: None  ? Collection Time: 12/24/21 11:08 PM  ?Result Value Ref Range  ? Lipase 34 11 - 51 U/L  ?Comprehensive metabolic panel     Status: Abnormal  ? Collection Time: 12/24/21 11:08 PM  ?Result Value Ref Range  ? Sodium 139 135 - 145 mmol/L  ? Potassium 4.0 3.5 - 5.1 mmol/L  ? Chloride 106 98 - 111 mmol/L  ? CO2 26 22 - 32 mmol/L  ? Glucose, Bld 101 (H) 70 - 99 mg/dL  ? BUN 13 6 - 20 mg/dL  ? Creatinine, Ser 0.91 0.61 - 1.24 mg/dL  ? Calcium 9.1 8.9 - 10.3 mg/dL  ? Total Protein 7.8 6.5 - 8.1 g/dL  ? Albumin 3.6 3.5 - 5.0 g/dL  ? AST 18 15 - 41 U/L  ? ALT 24 0 - 44 U/L  ? Alkaline Phosphatase 81 38 - 126 U/L  ? Total Bilirubin 0.3 0.3 - 1.2 mg/dL  ? GFR, Estimated >60 >60 mL/min  ? Anion gap 7 5 - 15  ?CBC     Status: Abnormal  ? Collection Time: 12/24/21 11:08 PM  ?Result Value Ref Range  ? WBC 14.7 (H) 4.0 - 10.5 K/uL  ? RBC 5.10 4.22 - 5.81 MIL/uL  ? Hemoglobin 14.5 13.0 - 17.0 g/dL  ? HCT 45.0 39.0 - 52.0 %  ? MCV 88.2 80.0 - 100.0 fL  ? MCH 28.4 26.0 - 34.0 pg  ? MCHC 32.2 30.0 - 36.0 g/dL  ? RDW 12.1 11.5 - 15.5 %  ? Platelets 316 150 - 400 K/uL  ? nRBC 0.0 0.0 - 0.2 %  ?Urinalysis, Routine w reflex microscopic     Status: Abnormal  ? Collection Time: 12/24/21 11:08 PM  ?Result Value Ref Range  ? Color, Urine YELLOW (A) YELLOW  ?  APPearance HAZY (A) CLEAR  ? Specific Gravity, Urine 1.026 1.005 - 1.030  ? pH 5.0 5.0 - 8.0  ? Glucose, UA NEGATIVE NEGATIVE mg/dL  ? Hgb urine dipstick NEGATIVE NEGATIVE  ? Bilirubin Urine NEGATIVE NEGATIVE  ? Ketones, ur NEGATIVE NEGATIVE mg/dL  ? Protein, ur NEGATIVE NEGATIVE mg/dL  ? Nitrite NEGATIVE  NEGATIVE  ? Leukocytes,Ua NEGATIVE NEGATIVE  ?Basic metabolic panel     Status: Abnormal  ? Collection Time: 12/25/21  4:54 AM  ?Result Value Ref Range  ? Sodium 138 135 - 145 mmol/L  ? Potassium 4.2 3.5 - 5.1 mmol/L  ? Chloride 106 98 - 111 mmol/L  ? CO2 26 22 - 32 mmol/L  ? Glucose, Bld 100 (H) 70 - 99 mg/dL  ? BUN 12 6 - 20 mg/dL  ? Creatinine, Ser 0.75 0.61 - 1.24 mg/dL  ? Calcium 8.3 (L) 8.9 - 10.3 mg/dL  ? GFR, Estimated >60 >60 mL/min  ? Anion gap 6 5 - 15  ?Magnesium     Status: None  ? Collection Time: 12/25/21  4:54 AM  ?Result Value Ref Range  ? Magnesium 2.0 1.7 - 2.4 mg/dL  ?CBC WITH DIFFERENTIAL     Status: Abnormal  ? Collection Time: 12/25/21  4:54 AM  ?Result Value Ref Range  ? WBC 11.3 (H) 4.0 - 10.5 K/uL  ? RBC 4.59 4.22 - 5.81 MIL/uL  ? Hemoglobin 13.1 13.0 - 17.0 g/dL  ? HCT 40.4 39.0 - 52.0 %  ? MCV 88.0 80.0 - 100.0 fL  ? MCH 28.5 26.0 - 34.0 pg  ? MCHC 32.4 30.0 - 36.0 g/dL  ? RDW 12.3 11.5 - 15.5 %  ? Platelets 255 150 - 400 K/uL  ? nRBC 0.0 0.0 - 0.2 %  ? Neutrophils Relative % 67 %  ? Neutro Abs 7.7 1.7 - 7.7 K/uL  ? Lymphocytes Relative 23 %  ? Lymphs Abs 2.6 0.7 - 4.0 K/uL  ? Monocytes Relative 7 %  ? Monocytes Absolute 0.8 0.1 - 1.0 K/uL  ? Eosinophils Relative 2 %  ? Eosinophils Absolute 0.2 0.0 - 0.5 K/uL  ? Basophils Relative 0 %  ? Basophils Absolute 0.0 0.0 - 0.1 K/uL  ? Immature Granulocytes 1 %  ? Abs Immature Granulocytes 0.06 0.00 - 0.07 K/uL  ? ? ?Imaging: ?CT Abdomen Pelvis W Contrast ? ?Result Date: 12/25/2021 ?CLINICAL DATA:  Abdominal pain, acute, nonlocalized.  Diarrhea. EXAM: CT ABDOMEN AND PELVIS WITH CONTRAST TECHNIQUE: Multidetector CT imaging of the abdomen and  pelvis was performed using the standard protocol following bolus administration of intravenous contrast. RADIATION DOSE REDUCTION: This exam was performed according to the departmental dose-optimization program which inc

## 2021-12-26 DIAGNOSIS — K3533 Acute appendicitis with perforation and localized peritonitis, with abscess: Secondary | ICD-10-CM | POA: Diagnosis not present

## 2021-12-26 LAB — CBC
HCT: 40.4 % (ref 39.0–52.0)
Hemoglobin: 12.9 g/dL — ABNORMAL LOW (ref 13.0–17.0)
MCH: 28.4 pg (ref 26.0–34.0)
MCHC: 31.9 g/dL (ref 30.0–36.0)
MCV: 88.8 fL (ref 80.0–100.0)
Platelets: 226 10*3/uL (ref 150–400)
RBC: 4.55 MIL/uL (ref 4.22–5.81)
RDW: 12.1 % (ref 11.5–15.5)
WBC: 10.9 10*3/uL — ABNORMAL HIGH (ref 4.0–10.5)
nRBC: 0 % (ref 0.0–0.2)

## 2021-12-26 LAB — BASIC METABOLIC PANEL
Anion gap: 5 (ref 5–15)
BUN: 12 mg/dL (ref 6–20)
CO2: 28 mmol/L (ref 22–32)
Calcium: 8.7 mg/dL — ABNORMAL LOW (ref 8.9–10.3)
Chloride: 106 mmol/L (ref 98–111)
Creatinine, Ser: 0.98 mg/dL (ref 0.61–1.24)
GFR, Estimated: >60 mL/min (ref >60)
Glucose, Bld: 93 mg/dL (ref 70–99)
Potassium: 4 mmol/L (ref 3.5–5.1)
Sodium: 139 mmol/L (ref 135–145)

## 2021-12-26 LAB — MAGNESIUM: Magnesium: 2.1 mg/dL (ref 1.7–2.4)

## 2021-12-26 MED ORDER — ACETAMINOPHEN 10 MG/ML IV SOLN
1000.0000 mg | Freq: Four times a day (QID) | INTRAVENOUS | Status: DC
  Administered 2021-12-26 – 2021-12-27 (×3): 1000 mg via INTRAVENOUS
  Filled 2021-12-26 (×5): qty 100

## 2021-12-26 MED ORDER — ACETAMINOPHEN 500 MG PO TABS
1000.0000 mg | ORAL_TABLET | Freq: Four times a day (QID) | ORAL | Status: DC
Start: 2021-12-26 — End: 2021-12-29
  Administered 2021-12-27 – 2021-12-29 (×7): 1000 mg via ORAL
  Filled 2021-12-26 (×11): qty 2

## 2021-12-26 MED ORDER — SODIUM CHLORIDE 0.9 % IV SOLN: INTRAVENOUS | Status: DC

## 2021-12-26 NOTE — Plan of Care (Signed)

## 2021-12-26 NOTE — TOC Initial Note (Signed)
?  Transition of Care (TOC) Screening Note ? ? ?Patient Details  ?Name: Nicholas Long ?Date of Birth: [birth year] ? ? ?Transition of Care (TOC) CM/SW Contact:    ?Hetty Ely, RN ?Phone Number: ?12/26/2021, 9:13 AM ? ? ? ?Transition of Care Department [facility]) has reviewed patient and no TOC needs have been identified at this time. We will continue to monitor patient advancement through interdisciplinary progression rounds. If new patient transition needs arise, please place a TOC consult. ?              ? ? ?  ?  ? ? ?Patient Goals and CMS Choice ?  ?  ?  ? ?Expected Discharge Plan and Services ?  ?  ?  ?  ?  ?                ?  ?  ?  ?  ?  ?  ?  ?  ?  ?  ? ?Prior Living Arrangements/Services ?  ?  ?  ?       ?  ?  ?  ?  ? ?Activities of Daily Living ?Home Assistive Devices/Equipment: Eyeglasses ?ADL Screening (condition at time of admission) ?Patient's cognitive ability adequate to safely complete daily activities?: Yes ?Is the patient deaf or have difficulty hearing?: No ?Does the patient have difficulty seeing, even when wearing glasses/contacts?: No ?Does the patient have difficulty concentrating, remembering, or making decisions?: No ?Patient able to express need for assistance with ADLs?: Yes ?Does the patient have difficulty dressing or bathing?: No ?Independently performs ADLs?: Yes (appropriate for developmental age) ?Does the patient have difficulty walking or climbing stairs?: No ?Weakness of Legs: None ?Weakness of Arms/Hands: None ? ?Permission Sought/Granted ?  ?  ?   ?   ?   ?   ? ?Emotional Assessment ?  ?  ?  ?  ?  ?  ? ?Admission diagnosis:  Periumbilical abdominal pain [R10.33] ?Acute perforated appendicitis [K35.32] ?Ruptured appendicitis [K35.32] ?Patient Active Problem List  ? Diagnosis Date Noted  ? Acute perforated appendicitis 12/25/2021  ? OSA (obstructive sleep apnea) 06/16/2018  ? Anxiety and depression 05/20/2014  ? Gynecomastia 05/20/2014  ? Hypogonadism in male 05/20/2014  ?  Eustachian tube dysfunction 05/20/2014  ? Morbid obesity (HCC) 05/20/2014  ? ?PCP:  Pcp, No ?Pharmacy:   ?[id number] Judithann Sheen, [place] - [address] ?[address] ?[place] [address] ?Phone: [phone] Fax: [phone] ? ? ? ? ?Social Determinants of Health (SDOH) Interventions ?  ? ?Readmission Risk Interventions ?No flowsheet data found. ? ? ?

## 2021-12-26 NOTE — Progress Notes (Signed)
CC: Appendicitis with phlegmon ?Subjective: ?Doing better.  Some right lower quadrant pain.  Tolerating clears.  No vomiting ?White count continues to go down. ? ? ?Objective: ?Vital signs in last 24 hours: ?Temp:  [98.1 ?F (36.7 ?C)-98.4 ?F (36.9 ?C)] 98.4 ?F (36.9 ?C) (03/21 3762) ?Pulse Rate:  [60-80] 77 (03/21 0738) ?Resp:  [18-20] 18 (03/21 0738) ?BP: (108-120)/(58-71) 120/71 (03/21 8315) ?SpO2:  [97 %-98 %] 98 % (03/21 0738) ?Last BM Date : 12/24/21 ? ?Intake/Output from previous day: ?03/20 0701 - 03/21 0700 ?In: 1525.7 [P.O.:600; I.V.:875.7; IV Piggyback:50] ?Out: -  ?Intake/Output this shift: ?Total I/O ?In: 480 [P.O.:480] ?Out: -  ? ?Physical exam: ? ?NAD alert ?Abd: soft, mild TTP RLQ no peritonitis ?Ext: well perfused w/o edema ? ?Lab Results: ?CBC  ?Recent Labs  ?  12/25/21 ?0454 12/26/21 ?1761  ?WBC 11.3* 10.9*  ?HGB 13.1 12.9*  ?HCT 40.4 40.4  ?PLT 255 226  ? ?BMET ?Recent Labs  ?  12/25/21 ?0454 12/26/21 ?6073  ?NA 138 139  ?K 4.2 4.0  ?CL 106 106  ?CO2 26 28  ?GLUCOSE 100* 93  ?BUN 12 12  ?CREATININE 0.75 0.98  ?CALCIUM 8.3* 8.7*  ? ?PT/INR ?No results for input(s): LABPROT, INR in the last 72 hours. ?ABG ?No results for input(s): PHART, HCO3 in the last 72 hours. ? ?Invalid input(s): PCO2, PO2 ? ?Studies/Results: ?CT Abdomen Pelvis W Contrast ? ?Result Date: 12/25/2021 ?CLINICAL DATA:  Abdominal pain, acute, nonlocalized.  Diarrhea. EXAM: CT ABDOMEN AND PELVIS WITH CONTRAST TECHNIQUE: Multidetector CT imaging of the abdomen and pelvis was performed using the standard protocol following bolus administration of intravenous contrast. RADIATION DOSE REDUCTION: This exam was performed according to the departmental dose-optimization program which includes automated exposure control, adjustment of the mA and/or kV according to patient size and/or use of iterative reconstruction technique. CONTRAST:  OMNIPAQUE IOHEXOL 300 MG/ML  SOLN COMPARISON:  None. FINDINGS: Lower chest: No acute abnormality.  Hepatobiliary: No focal liver abnormality is seen. Status post cholecystectomy. No biliary dilatation. Pancreas: Unremarkable Spleen: Unremarkable Adrenals/Urinary Tract: Adrenal glands are unremarkable. Kidneys are normal, without renal calculi, focal lesion, or hydronephrosis. Bladder is unremarkable. Stomach/Bowel: There is extensive inflammatory stranding immediately posterior to the cecum with heterogeneous enhancement in this location. The appendix is not clearly identified and, together, the findings suggest changes of a ruptured appendicitis. No discrete drainable fluid collection is identified. The inflammatory mass measures roughly 4.6 x 5.0 x 6.0 cm on sagittal image # 44 and axial image # 84. No free intraperitoneal gas or fluid. Shotty adenopathy within the ileocolic mesentery without frankly pathologic enlargement, likely reactive. The stomach, small bowel, and large bowel are otherwise unremarkable. Vascular/Lymphatic: No significant vascular findings are present. No enlarged abdominal or pelvic lymph nodes. Reproductive: Prostate is unremarkable. Other: No abdominal wall hernia.  Rectum unremarkable. Musculoskeletal: No acute or significant osseous findings. IMPRESSION: 6 cm inflammatory mass immediately posterior to the cecum with nonvisualization of the appendix. Together, the findings are most suggestive of ruptured appendicitis with development of inflammatory phlegmon. No discrete drainable fluid collection identified. No obstruction. Electronically Signed   By: Helyn Numbers M.D.   On: 12/25/2021 02:54   ? ?Anti-infectives: ?Anti-infectives (From admission, onward)  ? ? Start     Dose/Rate Route Frequency Ordered Stop  ? 12/25/21 0900  piperacillin-tazobactam (ZOSYN) IVPB 3.375 g       ? 3.375 g ?12.5 mL/hr over 240 Minutes Intravenous Every 8 hours 12/25/21 0341 01/01/22 0859  ? 12/25/21  0315  piperacillin-tazobactam (ZOSYN) IVPB 3.375 g       ? 3.375 g ?100 mL/hr over 30 Minutes Intravenous   Once 12/25/21 0304 12/25/21 0348  ? ?  ? ? ?Assessment/Plan: ?Appendicitis with phlegmon responded to medical therapy.  May start full liquid diet. ?No need for emergent ?May benefit from interval appendectomy in a couple months or so.  DisCussed with the patient detail. ?Continue antibiotics IV ?Decrease IVF ?Please note I spent more than 35 minutes in this encounter including personally reviewing records, imaging studies, placing orders, counseling the patient and performing appropriate documentation ? ? ? ?Sterling Big, MD, FACS ? ?12/26/2021 ? ? ? ?  ?

## 2021-12-27 DIAGNOSIS — K3532 Acute appendicitis with perforation and localized peritonitis, without abscess: Secondary | ICD-10-CM

## 2021-12-27 LAB — CBC
HCT: 38.7 % — ABNORMAL LOW (ref 39.0–52.0)
Hemoglobin: 12.6 g/dL — ABNORMAL LOW (ref 13.0–17.0)
MCH: 28.1 pg (ref 26.0–34.0)
MCHC: 32.6 g/dL (ref 30.0–36.0)
MCV: 86.4 fL (ref 80.0–100.0)
Platelets: 205 10*3/uL (ref 150–400)
RBC: 4.48 MIL/uL (ref 4.22–5.81)
RDW: 12.2 % (ref 11.5–15.5)
WBC: 8.5 10*3/uL (ref 4.0–10.5)
nRBC: 0 % (ref 0.0–0.2)

## 2021-12-27 NOTE — Progress Notes (Signed)
[facility] ?SURGICAL PROGRESS NOTE (cpt [phone]) ? ?Hospital Day(s): 2.  ? ?Interval History: Patient seen and examined, no acute events or new complaints overnight. Patient reports he is still sore in RLQ. Some nausea and emesis overnight as well.Marland Kitchen His leukocytosis has normalized this morning; now 8.5K. He continues on Zosyn. He was backed down to to CLD.  ? ?Review of Systems:  ?Constitutional: denies fever, chills  ?HEENT: denies cough or congestion  ?Respiratory: denies any shortness of breath  ?Cardiovascular: denies chest pain or palpitations  ?Gastrointestinal: + abdominal pain, + N/V ?Genitourinary: denies burning with urination or urinary frequency ? ?Vital signs in last 24 hours: [min-max] current  ?Temp:  [97.4 ?F (36.3 ?C)-102.7 ?F (39.3 ?C)] 97.4 ?F (36.3 ?C) (03/22 0408) ?Pulse Rate:  [77-108] 84 (03/22 0408) ?Resp:  [16-18] 16 (03/22 0408) ?BP: (91-120)/(39-71) 97/61 (03/22 0408) ?SpO2:  [95 %-99 %] 98 % (03/22 0408)     Height: 6\' 1"  (185.4 cm) Weight: (!) 174.6 kg BMI (Calculated): 50.81  ? ?Intake/Output last 2 shifts:  ?03/21 0701 - 03/22 0700 ?In: 567.1 [P.O.:480; I.V.:87.1] ?Out: -   ? ?Physical Exam:  ?Constitutional: alert, cooperative and no distress  ?HENT: normocephalic without obvious abnormality  ?Eyes: PERRL, EOM's grossly intact and symmetric  ?Respiratory: breathing non-labored at rest  ?Cardiovascular: regular rate and sinus rhythm  ?Gastrointestinal: Soft, RLQ soreness, non-distended, no rebound/guarding. He is without gross peritonitis  ?Musculoskeletal: no edema or wounds, motor and sensation grossly intact, NT  ? ? ?Labs:  ?CBC Latest Ref Rng & Units 12/27/2021 12/26/2021 12/25/2021  ?WBC 4.0 - 10.5 K/uL 8.5 10.9(H) 11.3(H)  ?Hemoglobin 13.0 - 17.0 g/dL 12.6(L) 12.9(L) 13.1  ?Hematocrit 39.0 - 52.0 % 38.7(L) 40.4 40.4  ?Platelets 150 - 400 K/uL 205 226 255  ? ?CMP Latest Ref Rng & Units 12/26/2021 12/25/2021 12/24/2021  ?Glucose 70 - 99 mg/dL 93 12/26/2021) 527(P)  ?BUN 6 -  20 mg/dL 12 12 13   ?Creatinine 0.61 - 1.24 mg/dL 824(M 3.53  ?Sodium 135 - 145 mmol/L 139 138 139  ?Potassium 3.5 - 5.1 mmol/L 4.0 4.2 4.0  ?Chloride 98 - 111 mmol/L 106 106 106  ?CO2 22 - 32 mmol/L 28 26 26   ?Calcium 8.9 - 10.3 mg/dL 6.14) 8.3(L) 9.1  ?Total Protein 6.5 - 8.1 g/dL - - 7.8  ?Total Bilirubin 0.3 - 1.2 mg/dL - - 0.3  ?Alkaline Phos 38 - 126 U/L - - 81  ?AST 15 - 41 U/L - - 18  ?ALT 0 - 44 U/L - - 24  ? ? ?Imaging studies: No new pertinent imaging studies ? ? ?Assessment/Plan: (ICD-10's: K35.32) ?31 y.o. male with improved leukocytosis but persistent abdominal discomfort and N/V overnight admitted with ruptured appendicitis with large 6 cm inflammatory phlegmon ? ? - No emergent surgical intervention; Will benefit from interval appendectomy in outpatient setting in a few weeks if he responds to conservative management of this acute insult.  ? - Will plan on repeat CT Abdomen/Pelvis tomorrow (03/23) morning  ? - Continue CLD today ?- Continue IV Abx (Zosyn) ? - Continue IVF support  ? - Monitor abdominal examination ?- Pain control prn; antiemetics prn  ? - Monitor leukocytosis; resolved ?- Mobilization as tolerated ? ?All of the above findings and recommendations were discussed with the patient, and the medical team, and all of patient's questions were answered to his expressed satisfaction. ? ?-- ?5.4(M, PA-C ?[facility] ?12/27/2021, 7:34 AM ?[phone] ?M-F: 7am - 4pm ? ?

## 2021-12-27 NOTE — Plan of Care (Signed)

## 2021-12-28 ENCOUNTER — Inpatient Hospital Stay: Payer: BC Managed Care – PPO

## 2021-12-28 LAB — CBC
HCT: 40 % (ref 39.0–52.0)
Hemoglobin: 13 g/dL (ref 13.0–17.0)
MCH: 28.4 pg (ref 26.0–34.0)
MCHC: 32.5 g/dL (ref 30.0–36.0)
MCV: 87.3 fL (ref 80.0–100.0)
Platelets: 199 10*3/uL (ref 150–400)
RBC: 4.58 MIL/uL (ref 4.22–5.81)
RDW: 12.2 % (ref 11.5–15.5)
WBC: 7.2 10*3/uL (ref 4.0–10.5)
nRBC: 0 % (ref 0.0–0.2)

## 2021-12-28 LAB — BASIC METABOLIC PANEL
Anion gap: 7 (ref 5–15)
BUN: 14 mg/dL (ref 6–20)
CO2: 25 mmol/L (ref 22–32)
Calcium: 8.6 mg/dL — ABNORMAL LOW (ref 8.9–10.3)
Chloride: 109 mmol/L (ref 98–111)
Creatinine, Ser: 1.01 mg/dL (ref 0.61–1.24)
GFR, Estimated: >60 mL/min (ref >60)
Glucose, Bld: 90 mg/dL (ref 70–99)
Potassium: 3.7 mmol/L (ref 3.5–5.1)
Sodium: 141 mmol/L (ref 135–145)

## 2021-12-28 MED ORDER — AMPICILLIN-SULBACTAM SODIUM 3 (2-1) G IJ SOLR
3.0000 g | Freq: Four times a day (QID) | INTRAMUSCULAR | Status: DC
  Administered 2021-12-28 – 2021-12-29 (×3): 3 g via INTRAVENOUS
  Filled 2021-12-28 (×4): qty 8

## 2021-12-28 MED ORDER — IOHEXOL 300 MG/ML  SOLN
125.0000 mL | Freq: Once | INTRAMUSCULAR | Status: AC | PRN
  Administered 2021-12-28: 125 mL via INTRAVENOUS

## 2021-12-28 MED ORDER — IOHEXOL 9 MG/ML PO SOLN
500.0000 mL | ORAL | Status: AC
  Administered 2021-12-28 (×2): 500 mL via ORAL

## 2021-12-28 NOTE — Progress Notes (Signed)
[facility] ?SURGICAL PROGRESS NOTE (cpt [phone]) ? ?Hospital Day(s): 3.  ? ?Interval History: Patient seen and examined, no acute events or new complaints overnight. Patient reports he is feeling much better this morning. His abdominal pain is resolved. No further fever, chills, nausea, emesis. He remains without leukocytosis; WBC 7.2K. Renal function remains normal with sCr - 1.01. He continues on Zosyn. He has been on CLD; tolerating well.  ? ?Review of Systems:  ?Constitutional: denies fever, chills  ?HEENT: denies cough or congestion  ?Respiratory: denies any shortness of breath  ?Cardiovascular: denies chest pain or palpitations  ?Gastrointestinal: Denied abdominal pain, nausea, emesis  ?Genitourinary: denies burning with urination or urinary frequency ? ?Vital signs in last 24 hours: [min-max] current  ?Temp:  [98 ?F (36.7 ?C)-98.4 ?F (36.9 ?C)] 98.4 ?F (36.9 ?C) (03/22 2047) ?Pulse Rate:  [57-82] 57 (03/23 0453) ?Resp:  [17-18] 18 (03/23 0453) ?BP: (108-118)/(64-75) 117/75 (03/23 0453) ?SpO2:  [97 %-98 %] 97 % (03/23 0453)     Height: 6\' 1"  (185.4 cm) Weight: (!) 174.6 kg BMI (Calculated): 50.81  ? ?Intake/Output last 2 shifts:  ?03/22 0701 - 03/23 0700 ?In: 2720 [P.O.:1020; I.V.:1449.6; IV Piggyback:250.4] ?Out: -   ? ?Physical Exam:  ?Constitutional: alert, cooperative and no distress  ?HENT: normocephalic without obvious abnormality  ?Eyes: PERRL, EOM's grossly intact and symmetric  ?Respiratory: breathing non-labored at rest  ?Cardiovascular: regular rate and sinus rhythm  ?Gastrointestinal: Soft, non-tender this morning, non-distended, no rebound/guarding. He is without gross peritonitis  ?Musculoskeletal: no edema or wounds, motor and sensation grossly intact, NT  ? ? ?Labs:  ? ?  Latest Ref Rng & Units 12/28/2021  ?  5:39 AM 12/27/2021  ?  5:35 AM 12/26/2021  ?  5:29 AM  ?CBC  ?WBC 4.0 - 10.5 K/uL 7.2   8.5   10.9    ?Hemoglobin 13.0 - 17.0 g/dL 12/28/2021   24.4   01.0    ?Hematocrit 39.0 -  52.0 % 40.0   38.7   40.4    ?Platelets 150 - 400 K/uL 199   205   226    ? ? ?  Latest Ref Rng & Units 12/28/2021  ?  5:39 AM 12/26/2021  ?  5:29 AM 12/25/2021  ?  4:54 AM  ?CMP  ?Glucose 70 - 99 mg/dL 90   93   12/27/2021    ?BUN 6 - 20 mg/dL 14   12   12     ?Creatinine 0.61 - 1.24 mg/dL 536     6.44    ?Sodium 135 - 145 mmol/L 141   139   138    ?Potassium 3.5 - 5.1 mmol/L 3.7   4.0   4.2    ?Chloride 98 - 111 mmol/L 109   106   106    ?CO2 22 - 32 mmol/L 25   28   26     ?Calcium 8.9 - 10.3 mg/dL 8.6   8.7   8.3    ? ? ?Imaging studies: ? ?CT Abdomen/Pelvis (12/28/2021) personally reviewed still phlegmon appearance surrounding appendix, no discernable abscess, no free air, and radiologist report reviewed pending ? ? ?Assessment/Plan: (ICD-10's: K35.32) ?31 y.o. male with resolution in leukocytosis and abdominal pain admitted with ruptured appendicitis with large 6 cm inflammatory phlegmon ? ? - No emergent surgical intervention; Will benefit from evaluation for potential interval appendectomy in outpatient setting in a few weeks if he responds to conservative management of this acute insult.  ? ? -  Advance diet as tolerated today ?- Continue IV Abx (Zosyn): transition to PO at discharge (complete 14 days total IV + PO) ? - Discontinue IVF support  ? - Monitor abdominal examination ?- Pain control prn; antiemetics prn  ? - Monitor leukocytosis; resolved ?- Mobilization as tolerated ? ? - Discharge Planning: Pending advancement of diet today, hopefully home in the AM.  ? ?All of the above findings and recommendations were discussed with the patient, and the medical team, and all of patient's questions were answered to his expressed satisfaction. ? ?-- ?Lynden Oxford, PA-C [facility] ?12/28/2021, 6:55 AM ?[phone] ?M-F: 7am - 4pm ? ?

## 2021-12-28 NOTE — Progress Notes (Signed)
Mobility Specialist - Progress Note ? ? ? 12/28/21 1500  ?Mobility  ?Activity Ambulated independently in hallway;Stood at bedside;Dangled on edge of bed  ?Level of Assistance Independent  ?Assistive Device None  ?Distance Ambulated (ft) 350 ft  ?Activity Response Tolerated well  ?$Mobility charge 1 Mobility  ? ? ?Pre-mobility: 108 HR, 95% SpO2 ?During mobility: 112 HR, 96% SpO2 ? ?Pt supine upon arrival using RA. Pt completes bed mobility, STS and ambulation indep voicing no complaints. Pt returns to bed with needs in reach. ? ?Nicholas Long ?Mobility Specialist ?12/28/21, 3:07 PM ? ? ? ? ?

## 2021-12-28 NOTE — Plan of Care (Signed)

## 2021-12-28 NOTE — Progress Notes (Signed)
To Radiology via wheelchair. Windy Carina, RN 6:38 AM 12/28/2021  ?

## 2021-12-29 MED ORDER — OXYCODONE HCL 5 MG PO TABS: 5.0000 mg | ORAL_TABLET | Freq: Four times a day (QID) | ORAL | 0 refills | Status: DC | PRN

## 2021-12-29 MED ORDER — OXYCODONE HCL 5 MG PO TABS
5.0000 mg | ORAL_TABLET | ORAL | Status: DC | PRN
  Administered 2021-12-29: 10 mg via ORAL
  Filled 2021-12-29: qty 2

## 2021-12-29 MED ORDER — AMOXICILLIN-POT CLAVULANATE 875-125 MG PO TABS: 1.0000 | ORAL_TABLET | Freq: Two times a day (BID) | ORAL | 0 refills | Status: AC

## 2021-12-29 NOTE — Discharge Instructions (Signed)
In addition to included general instructions, ? ?Diet: Resume home diet.   ? ?Medications: Complete Augmentin (antibiotic) as prescribed.  ? ?Call office [phone] / [phone]) at any time if any questions, worsening pain, fevers/chills, bleeding, drainage from incision site, or other concerns. ? ?

## 2021-12-29 NOTE — Plan of Care (Signed)
Patient discharged home. Discharge instructions including medications, and care reviewed and given to patient who verbalized understanding.  PIV removed prior to discharge. ?

## 2021-12-29 NOTE — Discharge Summary (Addendum)
[facility] ?SURGICAL DISCHARGE SUMMARY (cpt: [phone]) ? ?Patient ID: ?Nicholas Long ?MRN: [id number] ?DOB/AGE: Feb 23, 1991 31 y.o. ? ?Admit date: 12/25/2021 ?Discharge date: 12/29/2021 ? ?Discharge Diagnoses ?Patient Active Problem List  ? Diagnosis Date Noted  ? Acute perforated appendicitis 12/25/2021  ? ? ?Consultants ?None ? ?Procedures ?None ? ?HPI: Nicholas Long is a 31 y.o. male presenting with a 2 week history of right lower quadrant abdominal pain.  He reports that the pain started two weeks ago but did not pay as much attention to it as he's been very busy traveling for work.  Unclear if it was getting worse in those two weeks, but for sure he has noted worsening over the past 3 days since Friday 3/17.  He denies fevers or chills, chest pain, shortness of breath, nausea.  He reports he had one episode of emesis but is related to a chronic cough that he's had since he had COVID.  He also reports diarrhea x 1 after taking metamucil when the pain initially started.  Has not been as hungry recently either.  He also reports some right lower back pain. ?  ?In the ED, his labwork was significant for an elevated WBC of 14.7 and otherwise unremarkable labs.  He had a CT scan of abdomen/pelvis which I have personally viewed.  It shows acute appendicitis with a 6 cm phlegmon in the RLQ.  The appendix itself is hard to visualize within this phlegmon.  There is no abscess or free air. ? ?Hospital Course: Patient was admitted to the general surgery service and underwent conservative management. He did have some issues with nausea early in the admission but this ultimately resolved. He underwent CT Abdomen/Pelvis on 03/23 which was reassuring and without any evidence of worsening abscess or perforation. He tolerated advancement of diet. Leukocytosis resolved on HD2. The remainder of patient's hospital course was essentially unremarkable, and discharge planning was initiated accordingly with  patient safely able to be discharged home with appropriate discharge instructions, antibiotics (Augmentin x10 days), pain control, and outpatient follow-up after all of his questions were answered to his expressed satisfaction.  ? ?Discharge Condition: Good ? ? ?Physical Examination:  ?Constitutional: alert, cooperative and no distress  ?HENT: normocephalic without obvious abnormality  ?Eyes: PERRL, EOM's grossly intact and symmetric  ?Respiratory: breathing non-labored at rest  ?Cardiovascular: regular rate and sinus rhythm  ?Gastrointestinal: Soft, non-tender this morning, non-distended, no rebound/guarding. He is without gross peritonitis  ?Musculoskeletal: no edema or wounds, motor and sensation grossly intact, NT ? ? ?Allergies as of 12/29/2021   ? ?   Reactions  ? Escitalopram   ? Made jittery and caused sharp pain in head  ? Morphine And Related Other (See Comments)  ? Burning in veins  ? ?  ? ?  ?Medication List  ?  ? ?TAKE these medications   ? ?amoxicillin-clavulanate 875-125 MG tablet ?Commonly known as: Augmentin ?Take 1 tablet by mouth 2 (two) times daily for 10 days. ?  ?oxyCODONE 5 MG immediate release tablet ?Commonly known as: Oxy IR/ROXICODONE ?Take 1 tablet (5 mg total) by mouth every 6 (six) hours as needed for severe pain or breakthrough pain. ?  ? ?  ? ? ? ? Follow-up Information   ? ? Olean Ree, MD. Schedule an appointment as soon as possible for a visit on 01/24/2022.   ?Specialty: General Surgery ?Why: patient requesting April 19th.Marland KitchenMarland KitchenMarland KitchenHospital follow up, appendicitis ?Contact information: ?[address] ?[address] ?[place] [address] ?[phone] ? ? ?  ?  ? ?  ?  ? ?  ? ? ? ?  Time spent on discharge management including discussion of hospital course, clinical condition, outpatient instructions, prescriptions, and follow up with the patient and members of the medical team: >30 minutes ? ?-- ?Edison Simon , PA-C ?[facility]  ?12/29/2021, 8:13  AM ?[phone] ?M-F: 7am - 4pm ? ?

## 2022-01-09 ENCOUNTER — Other Ambulatory Visit: Payer: Self-pay

## 2022-01-09 ENCOUNTER — Emergency Department (HOSPITAL_BASED_OUTPATIENT_CLINIC_OR_DEPARTMENT_OTHER)
Admission: EM | Admit: 2022-01-09 | Discharge: 2022-01-09 | Disposition: A | Discharge status: Home / Self Care | Payer: BC Managed Care – PPO | Attending: Emergency Medicine | Admitting: Emergency Medicine

## 2022-01-09 ENCOUNTER — Encounter (HOSPITAL_BASED_OUTPATIENT_CLINIC_OR_DEPARTMENT_OTHER): Payer: Self-pay | Admitting: Emergency Medicine

## 2022-01-09 ENCOUNTER — Emergency Department (HOSPITAL_BASED_OUTPATIENT_CLINIC_OR_DEPARTMENT_OTHER): Payer: BC Managed Care – PPO

## 2022-01-09 DIAGNOSIS — R4789 Other speech disturbances: Secondary | ICD-10-CM | POA: Diagnosis not present

## 2022-01-09 DIAGNOSIS — R29818 Other symptoms and signs involving the nervous system: Secondary | ICD-10-CM

## 2022-01-09 DIAGNOSIS — R200 Anesthesia of skin: Secondary | ICD-10-CM | POA: Insufficient documentation

## 2022-01-09 DIAGNOSIS — R479 Unspecified speech disturbances: Secondary | ICD-10-CM

## 2022-01-09 LAB — URINALYSIS, ROUTINE W REFLEX MICROSCOPIC
Bilirubin Urine: NEGATIVE
Glucose, UA: NEGATIVE mg/dL
Hgb urine dipstick: NEGATIVE
Ketones, ur: NEGATIVE mg/dL
Leukocytes,Ua: NEGATIVE
Nitrite: NEGATIVE
Protein, ur: NEGATIVE mg/dL
Specific Gravity, Urine: >=1.03 (ref 1.005–1.030)
pH: 5 (ref 5.0–8.0)

## 2022-01-09 LAB — BASIC METABOLIC PANEL
Anion gap: 8 (ref 5–15)
BUN: 16 mg/dL (ref 6–20)
CO2: 24 mmol/L (ref 22–32)
Calcium: 8.9 mg/dL (ref 8.9–10.3)
Chloride: 105 mmol/L (ref 98–111)
Creatinine, Ser: 0.87 mg/dL (ref 0.61–1.24)
GFR, Estimated: >60 mL/min (ref >60)
Glucose, Bld: 117 mg/dL — ABNORMAL HIGH (ref 70–99)
Potassium: 3.9 mmol/L (ref 3.5–5.1)
Sodium: 137 mmol/L (ref 135–145)

## 2022-01-09 LAB — CBC WITH DIFFERENTIAL/PLATELET
Abs Immature Granulocytes: 0.02 10*3/uL (ref 0.00–0.07)
Basophils Absolute: 0.1 10*3/uL (ref 0.0–0.1)
Basophils Relative: 1 %
Eosinophils Absolute: 0.2 10*3/uL (ref 0.0–0.5)
Eosinophils Relative: 2 %
HCT: 44 % (ref 39.0–52.0)
Hemoglobin: 14.5 g/dL (ref 13.0–17.0)
Immature Granulocytes: 0 %
Lymphocytes Relative: 22 %
Lymphs Abs: 2.3 10*3/uL (ref 0.7–4.0)
MCH: 28.8 pg (ref 26.0–34.0)
MCHC: 33 g/dL (ref 30.0–36.0)
MCV: 87.5 fL (ref 80.0–100.0)
Monocytes Absolute: 0.8 10*3/uL (ref 0.1–1.0)
Monocytes Relative: 8 %
Neutro Abs: 7.1 10*3/uL (ref 1.7–7.7)
Neutrophils Relative %: 67 %
Platelets: 278 10*3/uL (ref 150–400)
RBC: 5.03 MIL/uL (ref 4.22–5.81)
RDW: 12.8 % (ref 11.5–15.5)
WBC: 10.5 10*3/uL (ref 4.0–10.5)
nRBC: 0 % (ref 0.0–0.2)

## 2022-01-09 LAB — CBG MONITORING, ED: Glucose-Capillary: 105 mg/dL — ABNORMAL HIGH (ref 70–99)

## 2022-01-09 NOTE — ED Notes (Addendum)
Pt  states he had  some numbness on his rt cheek and his  rt hand was numb , it did get better , pt also felt that his speechwas slurred , , pt states he was recently in hospital for ruptured appendix and still is on antibiotics,  pt is aaox4 was able to stand to void  ?

## 2022-01-09 NOTE — ED Provider Notes (Addendum)
?[facility] ?Provider Note ? ? ?CSN: [id number] ?Arrival date & time: 01/09/22  1802 ? ?  ? ?History ? ?Chief Complaint  ?Patient presents with  ? Numbness  ? ? ?Nicholas Long is a 31 y.o. male.  Presented to the emergency department with concern for episode of numbness on his right arm, right cheek and speech disturbance.  States that his symptoms lasted only a few minutes in duration and then seem to resolve.  Occurred while he was at work.  Described word finding difficulty, some general fatigue/lightheadedness/confusion.  States that he does not have difficulty with his words at present and this is completely resolved.  States that the numbness was a tingling sensation on his right cheek as well as his right arm.  Right arm tingling was isolated from forearm to wrist and hand.  Did not have associated weakness. ? ?He denies any major medical problems.  Patient is obese.  Denies prior episodes similar to this. ? ?Recent admission for appendicitis. ? ?HPI ? ?  ? ?Home Medications ?Prior to Admission medications   ?Medication Sig Start Date End Date Taking? Authorizing Provider  ?oxyCODONE (OXY IR/ROXICODONE) 5 MG immediate release tablet Take 1 tablet (5 mg total) by mouth every 6 (six) hours as needed for severe pain or breakthrough pain. 12/29/21   Tylene Fantasia, PA-C  ?   ? ?Allergies    ?Escitalopram and Morphine and related   ? ?Review of Systems   ?Review of Systems  ?Constitutional:  Negative for chills and fever.  ?HENT:  Negative for ear pain and sore throat.   ?Eyes:  Negative for pain and visual disturbance.  ?Respiratory:  Negative for cough and shortness of breath.   ?Cardiovascular:  Negative for chest pain and palpitations.  ?Gastrointestinal:  Negative for abdominal pain and vomiting.  ?Genitourinary:  Negative for dysuria and hematuria.  ?Musculoskeletal:  Negative for arthralgias and back pain.  ?Skin:  Negative for color change and rash.  ?Neurological:   Positive for speech difficulty and numbness. Negative for seizures and syncope.  ?All other systems reviewed and are negative. ? ?Physical Exam ?Updated Vital Signs ?BP 136/82 (BP Location: Right Arm)   Pulse 90   Temp 98.4 ?F (36.9 ?C) (Oral)   Resp 19   Ht 6\' 1"  (1.854 m)   Wt (!) 176.9 kg   SpO2 98%   BMI 51.45 kg/m?  ?Physical Exam ?Vitals and nursing note reviewed.  ?Constitutional:   ?   General: He is not in acute distress. ?   Appearance: He is well-developed.  ?HENT:  ?   Head: Normocephalic and atraumatic.  ?Eyes:  ?   Conjunctiva/sclera: Conjunctivae normal.  ?Cardiovascular:  ?   Rate and Rhythm: Normal rate and regular rhythm.  ?   Heart sounds: No murmur heard. ?Pulmonary:  ?   Effort: Pulmonary effort is normal. No respiratory distress.  ?   Breath sounds: Normal breath sounds.  ?Abdominal:  ?   Palpations: Abdomen is soft.  ?   Tenderness: There is no abdominal tenderness.  ?Musculoskeletal:     ?   General: No swelling.  ?   Cervical back: Neck supple.  ?Skin: ?   General: Skin is warm and dry.  ?   Capillary Refill: Capillary refill takes less than 2 seconds.  ?Neurological:  ?   Mental Status: He is alert.  ?   Comments: AAOx3 ?CN 2-12 intact, speech clear visual fields intact ?5/5 strength in  b/l UE and LE ?Sensation to light touch intact in b/l UE and LE ?Normal FNF ?Normal gait  ?Psychiatric:     ?   Mood and Affect: Mood normal.  ? ? ?ED Results / Procedures / Treatments   ?Labs ?(all labs ordered are listed, but only abnormal results are displayed) ?Labs Reviewed  ?BASIC METABOLIC PANEL - Abnormal; Notable for the following components:  ?    Result Value  ? Glucose, Bld 117 (*)   ? All other components within normal limits  ?CBG MONITORING, ED - Abnormal; Notable for the following components:  ? Glucose-Capillary 105 (*)   ? All other components within normal limits  ?URINALYSIS, ROUTINE W REFLEX MICROSCOPIC  ?CBC WITH DIFFERENTIAL/PLATELET  ? ? ?EKG ?EKG  Interpretation ? ?Date/Time:  Tuesday January 09 2022 18:20:59 EDT ?Ventricular Rate:  99 ?PR Interval:  140 ?QRS Duration: 101 ?QT Interval:  321 ?QTC Calculation: 412 ?R Axis:   101 ?Text Interpretation: Sinus rhythm Artifact Confirmed by Lajean Saver [id number]) on 01/10/2022 12:10:01 PM ? ?Radiology ?CT HEAD WO CONTRAST ? ?Result Date: 01/09/2022 ?CLINICAL DATA:  TIA EXAM: CT HEAD WITHOUT CONTRAST TECHNIQUE: Contiguous axial images were obtained from the base of the skull through the vertex without intravenous contrast. RADIATION DOSE REDUCTION: This exam was performed according to the departmental dose-optimization program which includes automated exposure control, adjustment of the mA and/or kV according to patient size and/or use of iterative reconstruction technique. COMPARISON:  None. FINDINGS: Brain: No acute intracranial abnormality. Specifically, no hemorrhage, hydrocephalus, mass lesion, acute infarction, or significant intracranial injury. Vascular: No hyperdense vessel or unexpected calcification. Skull: No acute calvarial abnormality. Sinuses/Orbits: No acute findings Other: None IMPRESSION: Normal study Electronically Signed   By: Rolm Baptise M.D.   On: 01/09/2022 19:09   ? ?Procedures ?Procedures  ? ? ?Medications Ordered in ED ?Medications - No data to display ? ?ED Course/ Medical Decision Making/ A&P ?  ?  ABCD2 Score: 2                     ?Medical Decision Making ?Amount and/or Complexity of Data Reviewed ?Labs: ordered. ?Radiology: ordered. ? ? ?31 year old gentleman presenting to ER after having an episode of right arm tingling/right cheek tingling/speech disturbance.  On arrival to ER his symptoms had completely resolved.  He has a normal neurologic exam.  The description of the event was concerning for TIA versus near syncope versus complex migraine versus functional neurologic process.  CT head was obtained, I independently reviewed.  No acute findings noted.  Basic lab work reviewed, no anemia  or electrolyte derangement.  Patient was observed in ER and had no recurrence of symptoms he was noted to be slightly hypertensive initially however without intervention this resolved.  If this was in fact a true TIA, his ABCD2 score places him in a low risk category, and would be most appropriate for outpatient follow-up.  Patient does not have regular primary care doctor.  Do feel he would benefit from follow-up for further evaluation by neurology on an outpatient basis but do not feel he requires admission at this time.  I reviewed return precautions with patient as well as his coworker/friend at bedside.  Updated throughout stay.  Discharged home. ? ? ? ?After the discussed management above, the patient was determined to be safe for discharge.  The patient was in agreement with this plan and all questions regarding their care were answered.  ED return precautions were discussed and the  patient will return to the ED with any significant worsening of condition. ? ? ? ? ? ? ? ? ?Final Clinical Impression(s) / ED Diagnoses ?Final diagnoses:  ?Transient speech disturbance  ?Transient neurological symptoms  ? ? ?Rx / DC Orders ?ED Discharge Orders   ? ?      Ordered  ?  Ambulatory referral to Neurology       ?Comments: An appointment is requested in approximately: 2 weeks ? ?TIA like symptoms - transient speech and numbness episode, resolved prior to ER  ? 01/09/22 2107  ? ?  ?  ? ?  ? ? ?  ?Lucrezia Starch, MD ?01/10/22 1620 ? ?  ?Lucrezia Starch, MD ?01/10/22 1627 ? ?

## 2022-01-09 NOTE — ED Triage Notes (Signed)
Pt arrives pov, to triage in wheelchair, endorses right cheek and right arm numbness x 1 hr pta. Also reports slurred speech ?

## 2022-01-09 NOTE — ED Notes (Signed)
Patient discharged to home.  All discharge instructions reviewed.  Patient verbalized understanding via teachback method.  VS WDL.  Respirations even and unlabored.  Ambulatory out of ED.   °

## 2022-01-09 NOTE — Discharge Instructions (Signed)
Please follow-up with your primary care doctor as well as the neurologist regarding this episode that you had today.  If you have any new episodes, any symptoms such as chest pain, difficulty breathing, abdominal pain, numbness, weakness, speech or vision change, come back to ER for reassessment. ?

## 2022-01-11 ENCOUNTER — Encounter: Payer: Self-pay | Admitting: Diagnostic Neuroimaging

## 2022-01-11 ENCOUNTER — Ambulatory Visit: Payer: BC Managed Care – PPO | Admitting: Diagnostic Neuroimaging

## 2022-01-11 VITALS — BP 122/80 | HR 84 | Ht 74.0 in | Wt 392.0 lb

## 2022-01-11 DIAGNOSIS — R4689 Other symptoms and signs involving appearance and behavior: Secondary | ICD-10-CM | POA: Diagnosis not present

## 2022-01-11 DIAGNOSIS — R200 Anesthesia of skin: Secondary | ICD-10-CM | POA: Diagnosis not present

## 2022-01-11 NOTE — Progress Notes (Signed)
? ?[facility] ? ?PATIENT: Nicholas Long ?DOB: [birth year] ? ?REFERRING CLINICIAN: Lucrezia Starch, MD ?HISTORY FROM: patient  ?REASON FOR VISIT: new consult  ? ? ?HISTORICAL ? ?CHIEF COMPLAINT:  ?Chief Complaint  ?Patient presents with  ? New Patient (Initial Visit)  ?  Rm 7 with wife here for consult on TIA like symptoms- pt reports tues of this week he was working on the computer started having blurry vision numbness/tingling right side, slowing of speech, and headaches. Pt reports this was the first event like this.  ? ? ?HISTORY OF PRESENT ILLNESS:  ? ?31 year old male here for evaluation of TIA versus complicated migraine. ? ?12/25/2021 patient presented with 2 weeks of right lower abdominal pain, was diagnosed with acute perforated appendicitis.  He was hospitalized for 4 to 5 days.  He was felt to be stable for conservative management with antibiotics. ? ?//23 patient had onset of blurred vision, speech difficulty, spelling difficulty, dizziness, right-sided headache, blurred vision, seeing stars, right face and right arm numbness.  He was taken to [facility] for evaluation.  Symptoms started around 430p with headache and some neurologic symptoms at 515p.  By 830p symptoms had essentially resolved.  Lab testing and CT of the head were unremarkable.  He was discharged home with possible TIA versus complicated migraine. ? ?No prior history of headaches or migraine.  Patient's father has had migraines.  Patient has sleep apnea and uses CPAP variably.  Also has been under significant stress.  Also dealing with obesity. ? ? ? ?REVIEW OF SYSTEMS: Full 14 system review of systems performed and negative with exception of: as per HPI. ? ?ALLERGIES: ?Allergies  ?Allergen Reactions  ? Escitalopram   ?  Made jittery and caused sharp pain in head  ? Morphine And Related Other (See Comments)  ?  Burning in veins  ? ? ?HOME MEDICATIONS: ?Outpatient Medications Prior to Visit   ?Medication Sig Dispense Refill  ? Acetaminophen (TYLENOL PO) Take by mouth. (Patient not taking: Reported on 01/11/2022)    ? oxyCODONE (OXY IR/ROXICODONE) 5 MG immediate release tablet Take 1 tablet (5 mg total) by mouth every 6 (six) hours as needed for severe pain or breakthrough pain. 10 tablet 0  ? ?No facility-administered medications prior to visit.  ? ? ?PAST MEDICAL HISTORY: ?Past Medical History:  ?Diagnosis Date  ? Gallbladder & bile duct stone with obstruction 05.2009  ? ? ?PAST SURGICAL HISTORY: ?Past Surgical History:  ?Procedure Laterality Date  ? CHOLECYSTECTOMY    ? GALLBLADDER SURGERY  02/06/2008  ? ? ?FAMILY HISTORY: ?Family History  ?Problem Relation Age of Onset  ? Depression Mother   ?     Living  ? Diverticulosis Father   ? Pulmonary embolism Father   ? Hypertension Father   ? Bipolar disorder Sister   ? Cancer Sister   ?     Deceased at Age 46  ? Bipolar disorder Maternal Grandmother   ? Hypertension Maternal Grandfather   ? Stroke Maternal Grandfather   ? Ovarian cancer Paternal Grandmother   ? ? ?SOCIAL HISTORY: ?Social History  ? ?Socioeconomic History  ? Marital status: Married  ?  Spouse name: Not on file  ? Number of children: Not on file  ? Years of education: Not on file  ? Highest education level: Not on file  ?Occupational History  ? Not on file  ?Tobacco Use  ? Smoking status: Never  ? Smokeless tobacco: Never  ?Vaping Use  ?  Vaping Use: Never used  ?Substance and Sexual Activity  ? Alcohol use: Yes  ?  Comment: rare  ? Drug use: No  ? Sexual activity: Yes  ?  Comment: women -- OCPs  ?Other Topics Concern  ? Not on file  ?Social History Narrative  ? Right handed   ? Caffeine- 3-4 cups per day   ? ?Social Determinants of Health  ? ?Financial Resource Strain: Not on file  ?Food Insecurity: Not on file  ?Transportation Needs: Not on file  ?Physical Activity: Not on file  ?Stress: Not on file  ?Social Connections: Not on file  ?Intimate Partner Violence: Not on file  ? ? ? ?PHYSICAL  EXAM ? ?GENERAL EXAM/CONSTITUTIONAL: ?Vitals:  ?Vitals:  ? 01/11/22 1310  ?BP: 122/80  ?Pulse: 84  ?SpO2: 98%  ?Weight: (!) 392 lb (177.8 kg)  ?Height: 6\' 2"  (1.88 m)  ? ?Body mass index is 50.33 kg/m?. ?Wt Readings from Last 3 Encounters:  ?01/11/22 (!) 392 lb (177.8 kg)  ?01/09/22 (!) 390 lb (176.9 kg)  ?12/24/21 (!) 385 lb (174.6 kg)  ? ?Patient is in no distress; well developed, nourished and groomed; neck is supple ? ?CARDIOVASCULAR: ?Examination of carotid arteries is normal; no carotid bruits ?Regular rate and rhythm, no murmurs ?Examination of peripheral vascular system by observation and palpation is normal ? ?EYES: ?Ophthalmoscopic exam of optic discs and posterior segments is normal; no papilledema or hemorrhages ?No results found. ? ?MUSCULOSKELETAL: ?Gait, strength, tone, movements noted in Neurologic exam below ? ?NEUROLOGIC: ?MENTAL STATUS:  ?   ? View : No data to display.  ?  ?  ?  ? ?awake, alert, oriented to person, place and time ?recent and remote memory intact ?normal attention and concentration ?language fluent, comprehension intact, naming intact ?fund of knowledge appropriate ? ?CRANIAL NERVE:  ?2nd - no papilledema on fundoscopic exam ?2nd, 3rd, 4th, 6th - pupils equal and reactive to light, visual fields full to confrontation, extraocular muscles intact, no nystagmus ?5th - facial sensation symmetric ?7th - facial strength symmetric ?8th - hearing intact ?9th - palate elevates symmetrically, uvula midline ?11th - shoulder shrug symmetric ?12th - tongue protrusion midline ? ?MOTOR:  ?normal bulk and tone, full strength in the BUE, BLE ? ?SENSORY:  ?normal and symmetric to light touch, temperature, vibration ? ?COORDINATION:  ?finger-nose-finger, fine finger movements normal ? ?REFLEXES:  ?deep tendon reflexes TRACE and symmetric ? ?GAIT/STATION:  ?narrow based gait ? ? ? ?DIAGNOSTIC DATA (LABS, IMAGING, TESTING) ?- I reviewed patient records, labs, notes, testing and imaging myself where  available. ? ?Lab Results  ?Component Value Date  ? WBC 10.5 01/09/2022  ? HGB 14.5 01/09/2022  ? HCT 44.0 01/09/2022  ? MCV 87.5 01/09/2022  ? PLT 278 01/09/2022  ? ?   ?Component Value Date/Time  ? NA 137 01/09/2022 1835  ? K 3.9 01/09/2022 1835  ? CL 105 01/09/2022 1835  ? CO2 24 01/09/2022 1835  ? GLUCOSE 117 (H) 01/09/2022 1835  ? BUN 16 01/09/2022 1835  ? CREATININE 0.87 01/09/2022 1835  ? CREATININE 0.92 05/13/2014 1034  ? CALCIUM 8.9 01/09/2022 1835  ? PROT 7.8 12/24/2021 2308  ? ALBUMIN 3.6 12/24/2021 2308  ? AST 18 12/24/2021 2308  ? ALT 24 12/24/2021 2308  ? ALKPHOS 81 12/24/2021 2308  ? BILITOT 0.3 12/24/2021 2308  ? GFRNONAA >60 01/09/2022 1835  ? GFRAA >60 04/07/2016 0244  ? ?No results found for: CHOL, HDL, LDLCALC, LDLDIRECT, TRIG, CHOLHDL ?Lab Results  ?Component  Value Date  ? HGBA1C 5.6 10/14/2019  ? ?Lab Results  ?Component Value Date  ? [id number] 11/11/2019  ? ?Lab Results  ?Component Value Date  ? TSH 1.19 05/06/2018  ? ? ? ?01/09/22 CT head [I reviewed images myself and agree with interpretation. -VRP]  ?- NORMAL  ? ? ? ?ASSESSMENT AND PLAN ? ?31 y.o. year old male here with  ? ? ?Dx: ? ?1. Spell of abnormal behavior   ?2. Numbness   ? ? ? ?PLAN: ? ?TRANSIENT APHASIA, NUMBNESS, HEADACHES (lasting 3-4 hours) ?- suspect complicated migraine; will complete TIA workup (TIA less likely) ?- start aspirin 81mg  daily x 3-6 months ?- TIA and stroke mechanism, risk factors, prognosis and treatment options reviewed ? ?MIGRAINE WITH AURA ?- may use ibuprofen / tylenol as needed ?- may consider additional migraine treatments in future ? ?Orders Placed This Encounter  ?Procedures  ? MR BRAIN W WO CONTRAST  ? Lipid panel  ? Hemoglobin A1c  ? ECHOCARDIOGRAM COMPLETE BUBBLE STUDY  ? VAS US CAROTID  ? ?Return for pending if symptoms worsen or fail to improve, pending test results. ? ?I spent 60 minutes of face-to-face and non-face-to-face time with patient.  This included previsit chart review, lab review,  study review, order entry, electronic health record documentation, patient education.   ? ? ?Penni Bombard, MD [id number], [id number] PM ?Certified in Neurology, Neurophysiology and Neuroimaging ? ?[facility]

## 2022-01-11 NOTE — Patient Instructions (Signed)
?  TRANSIENT APHASIA, NUMBNESS, HEADACHES ?- suspect complicated migraine; will complete TIA workup (TIA less likely) ?- start aspirin 81mg  daily x 3-6 months ? ?MIGRAINE WITH AURA ?- may use ibuprofen / tylenol as needed ?

## 2022-01-12 LAB — LIPID PANEL
Chol/HDL Ratio: 3 ratio (ref 0.0–5.0)
Cholesterol, Total: 140 mg/dL (ref 100–199)
HDL: 46 mg/dL (ref >39)
LDL Chol Calc (NIH): 81 mg/dL (ref 0–99)
Triglycerides: 65 mg/dL (ref 0–149)
VLDL Cholesterol Cal: 13 mg/dL (ref 5–40)

## 2022-01-12 LAB — HEMOGLOBIN A1C
Est. average glucose Bld gHb Est-mCnc: 114 mg/dL
Hgb A1c MFr Bld: 5.6 % (ref 4.8–5.6)

## 2022-01-17 ENCOUNTER — Telehealth: Payer: Self-pay | Admitting: Diagnostic Neuroimaging

## 2022-01-17 NOTE — Telephone Encounter (Signed)
[employer] Auth: [id number] (exp. 01/17/22 to 02/15/22) order sent to GI. They will reach out to the patient to schedule.  ?

## 2022-01-19 ENCOUNTER — Other Ambulatory Visit: Payer: Self-pay | Admitting: Diagnostic Neuroimaging

## 2022-01-19 DIAGNOSIS — R200 Anesthesia of skin: Secondary | ICD-10-CM

## 2022-01-19 DIAGNOSIS — G459 Transient cerebral ischemic attack, unspecified: Secondary | ICD-10-CM

## 2022-01-24 ENCOUNTER — Encounter: Payer: Self-pay | Admitting: Surgery

## 2022-01-24 ENCOUNTER — Other Ambulatory Visit: Payer: BC Managed Care – PPO

## 2022-01-24 ENCOUNTER — Ambulatory Visit: Payer: BC Managed Care – PPO | Admitting: Surgery

## 2022-01-24 VITALS — BP 146/90 | HR 88 | Temp 98.4°F | Ht 72.0 in | Wt 386.8 lb

## 2022-01-24 DIAGNOSIS — K3532 Acute appendicitis with perforation and localized peritonitis, without abscess: Secondary | ICD-10-CM

## 2022-01-24 DIAGNOSIS — K3533 Acute appendicitis with perforation and localized peritonitis, with abscess: Secondary | ICD-10-CM | POA: Diagnosis not present

## 2022-01-24 NOTE — Progress Notes (Signed)
?01/24/2022 ? ?History of Present Illness: ?Nicholas Long is a 31 y.o. male presenting for follow up of acute perforated appendicitis.  He was admitted on 12/25/21 with acute perforated appendicitis with a large phlegmon.  Was treated conservatively with IV antibiotics, and repeat CT scan prior to discharge did not reveal any developing abscess.  He reports today that he's asymptomatic from the abdominal standpoint.  He denies any abdominal pain, nausea, vomiting, fevers, or chills.  He reports having some right thigh discomfort when leaving the hospital as a pinched nerve but that has been improving.  He also reports that on 01/09/22 he had an episode of right sided headache, with facial numbness, some speech impediment, and numbness in his right arm.  He presented to the ED and a negative head CT.  His symptoms resolved and he was discharged home with follow up with neurology.  He had a similar episode a few days later but on the left side.  He has seen neurology since and has a brain MRI scheduled for tomorrow, carotid ultrasound next week, and is currently on Aspirin 81 mg and Excedrin (differential of complicated migraine vs TIA).  Currently he denies any neurologic symptoms. ? ?Past Medical History: ?Past Medical History:  ?Diagnosis Date  ? Gallbladder & bile duct stone with obstruction 05.2009  ?  ? ?Past Surgical History: ?Past Surgical History:  ?Procedure Laterality Date  ? CHOLECYSTECTOMY    ? GALLBLADDER SURGERY  02/06/2008  ? ? ?Home Medications: ?Prior to Admission medications   ?Medication Sig Start Date End Date Taking? Authorizing Provider  ?aspirin EC 81 MG tablet Take 81 mg by mouth daily. Swallow whole.   Yes [provider]  ?aspirin-acetaminophen-caffeine (EXCEDRIN MIGRAINE) 360 083 1576 MG tablet Take by mouth every 6 (six) hours as needed for headache.   Yes [provider]  ? ? ?Allergies: ?Allergies  ?Allergen Reactions  ? Escitalopram   ?  Made jittery and caused sharp  pain in head  ? Morphine And Related Other (See Comments)  ?  Burning in veins  ? ? ?Review of Systems: ?Review of Systems  ?Constitutional:  Negative for chills and fever.  ?Respiratory:  Negative for shortness of breath.   ?Cardiovascular:  Negative for chest pain.  ?Gastrointestinal:  Negative for abdominal pain, nausea and vomiting.  ?Neurological:  Negative for sensory change and headaches.  ? ?Physical Exam ?BP (!) 146/90   Pulse 88   Temp 98.4 ?F (36.9 ?C) (Oral)   Ht 6' (1.829 m)   Wt (!) 386 lb 12.8 oz (175.5 kg)   SpO2 97%   BMI 52.46 kg/m?  ?CONSTITUTIONAL: No acute distress. ?HEENT:  Normocephalic, atraumatic, extraocular motion intact. ?RESPIRATORY:  Lungs are clear, and breath sounds are equal bilaterally. Normal respiratory effort without pathologic use of accessory muscles. ?CARDIOVASCULAR: Heart is regular without murmurs, gallops, or rubs. ?GI: The abdomen is soft, non-distended, non-tender to palpation.  ?NEUROLOGIC:  Motor and sensation is grossly normal.  Cranial nerves are grossly intact. ?PSYCH:  Alert and oriented to person, place and time. Affect is normal. ? ?Labs/Imaging: ?Labs from 01/09/22: ?Na 137, K 3.9, Cl 105, CO2 24, BUN 16, Cr 0.87.  WBC 10.5, Hgb 14.5, Hct 44, Plt 278.  Total cholesterol 140, HDL 46, LDL 81, Trig 65.  HgA1c 5.6 ? ?Head CT 01/09/22: ?FINDINGS: ?Brain: No acute intracranial abnormality. Specifically, no hemorrhage, hydrocephalus, mass lesion, acute infarction, or significant intracranial injury. ?  ?Vascular: No hyperdense vessel or unexpected calcification. ?  ?Skull:  No acute calvarial abnormality. ?  ?Sinuses/Orbits: No acute findings ?  ?Other: None ?  ?IMPRESSION: ?Normal study ? ?Assessment and Plan: ?This is a 31 y.o. male with acute perforated appendicitis with phlegmon and recent episode of TIA vs complicated migraine. ? ?--Discussed with the patient the previous abdomen/pelvis CT scans during his admission and again the rationale for treating him  conservatively with antibiotics rather than surgery.  He's currently asymptomatic and discussed with him the options going forward.  Would recommend repeating a CT scan in 2 months to evaluate the appendix, to see if there's a true appendix left or if it may have become so scarred with the perforation that may not need surgery going forwards.  With this, then we can discuss further if we should proceed with a laparoscopic appendectomy or not.  He's in agreement with this. ?--From the neurologic standpoint, he's due to brain MRI tomorrow and has carotid u/s and echo next week.  Based on these findings, we may need to delay any future surgery or would need neurology clearance prior to surgery if that's needed. ?--Follow up in 2 months. ? ?I spent 25 minutes dedicated to the care of this patient on the date of this encounter to include pre-visit review of records, face-to-face time with the patient discussing diagnosis and management, and any post-visit coordination of care. ? ? ?Howie Ill, MD ?[facility] ? ? ?  ?

## 2022-01-24 NOTE — Patient Instructions (Addendum)
Your CT is scheduled for Wednesday 03/21/2022 at 9 am (arrive by 8:30 am) @ [facility]. Nothing to eat or drink 4 hours prior. Please pick up the contrast between now and the day before CT. ? ? ?If you have any concerns or questions, please feel free to call our office. See follow up appointment below.  ? ?Appendicitis, Adult ? ?The appendix is a tube in the body that is shaped like a finger. It is attached to the large intestine. Appendicitis means that this tube is swollen (inflamed). If this is not treated, the tube can tear. This can lead to a life-threatening infection. This condition can also cause pus to build up in the appendix. ?What are the causes? ?Something blocking the appendix, such as: ?A ball of poop (stool). ?Lymph glands that are bigger than normal. ?Injury to the belly (abdomen). ?Sometimes the cause is not known. ?What increases the risk? ?You are more likely to get this condition if you are 11-65 years old. ?What are the signs or symptoms? ?Pain or tenderness that starts around the belly button and: ?Moves toward the lower right belly. ?Gets worse with time. ?Gets worse if you cough. ?Gets worse if you make a sudden move. ?Vomiting or feeling like you may vomit (nauseous). ?Not wanting to eat as much as normal (loss of appetite). ?A fever. ?Trouble pooping (constipation). ?Watery poop (diarrhea). ?Feel generally sick (malaise). ?How is this treated? ?In general, this condition is treated with both antibiotic medicines and surgery to take out the appendix. If only antibiotics are given and the appendix is not taken out, there is a chance the condition could come back. ?There are two ways to take out the appendix: ?Open surgery. For this method, the appendix is taken out through a large cut. This cut is called an incision and is made in the lower right belly. This surgery may be used if: ?You have scars from another surgery. ?You have a bleeding condition. ?You are pregnant and will be  having your baby soon. ?You have a condition that makes it hard to do the other type of surgery. ?Laparoscopic surgery. For this method, the appendix is taken out through small cuts. Often, this surgery: ?Causes less pain. ?Causes fewer problems. ?Heals faster. ?If your appendix tears and pus forms: ?A drain may be put into the sore. The drain will be used to get rid of the pus. ?You may get an antibiotic through an IV tube. ?Your appendix may or may not need to be taken out. ?Follow these instructions at home: ?If you had surgery: ?Follow instructions from your doctor on how to: ?Care for yourself at home. ?Take care of your cut from surgery. ?Medicines ?Take over-the-counter and prescription medicines only as told by your doctor. ?If you were prescribed an antibiotic medicine, take it as told by your doctor. Do not stop taking it even if you start to feel better. ?If told, take steps to prevent problems with pooping (constipation). You may need to: ?Drink enough fluid to keep your pee (urine) pale yellow. ?Take medicines. You will be told what medicines to take. ?Eat foods that are high in fiber. These include beans, whole grains, and fresh fruits and vegetables. ?Limit foods that are high in fat and sugar. These include fried or sweet foods. ?Ask your doctor if you should avoid driving or using machines while you are taking your medicine. ?General instructions ?Follow instructions from your doctor about what you cannot eat or drink. ?Do not  smoke or use any products that contain nicotine or tobacco. If you need help quitting, ask your doctor. ?Return to your normal activities when your doctor says that it is safe. ?Keep all follow-up visits. ?Contact a doctor if: ?There is pus, blood, or a lot of fluid coming from your cut or cuts from surgery. ?You feel like you may vomit, or you vomit. ?You have a fever. ?You are very tired. ?You have muscle pain. ?Get help right away if: ?You have pain in your belly, and the  pain is getting worse. ?You are short of breath. ?You cannot stop vomiting. ?These symptoms may be an emergency. Get help right away. Call your local emergency services (911 in the [place] ?Do not wait to see if the symptoms will go away. ?Do not drive yourself to the hospital. ?Summary ?Appendicitis is swelling of the appendix. The appendix is a tube that is shaped like a finger. It is joined to the large intestine. ?This condition may be caused by something that blocks the appendix. This can lead to an infection. ?This condition is most often treated with both antibiotic medicines and taking out the appendix. ?This information is not intended to replace advice given to you by your health care provider. Make sure you discuss any questions you have with your health care provider. ?Document Revised: 03/16/2021 Document Reviewed: 03/16/2021 ?Elsevier Patient Education ? 2023 Elsevier Inc. ? ?

## 2022-01-25 ENCOUNTER — Ambulatory Visit: Payer: BC Managed Care – PPO | Admitting: Registered Nurse

## 2022-01-25 ENCOUNTER — Ambulatory Visit
Admission: RE | Admit: 2022-01-25 | Discharge: 2022-01-25 | Disposition: A | Discharge status: Home / Self Care | Payer: BC Managed Care – PPO | Source: Ambulatory Visit | Attending: Diagnostic Neuroimaging | Admitting: Diagnostic Neuroimaging

## 2022-01-25 DIAGNOSIS — R4689 Other symptoms and signs involving appearance and behavior: Secondary | ICD-10-CM

## 2022-01-25 DIAGNOSIS — R200 Anesthesia of skin: Secondary | ICD-10-CM

## 2022-01-25 MED ORDER — GADOBENATE DIMEGLUMINE 529 MG/ML IV SOLN
20.0000 mL | Freq: Once | INTRAVENOUS | Status: AC | PRN
  Administered 2022-01-25: 20 mL via INTRAVENOUS

## 2022-01-29 ENCOUNTER — Ambulatory Visit: Payer: BC Managed Care – PPO | Admitting: Surgery

## 2022-01-29 ENCOUNTER — Emergency Department: Payer: BC Managed Care – PPO

## 2022-01-29 ENCOUNTER — Other Ambulatory Visit: Payer: Self-pay

## 2022-01-29 ENCOUNTER — Emergency Department
Admission: EM | Admit: 2022-01-29 | Discharge: 2022-01-29 | Disposition: A | Discharge status: Home / Self Care | Payer: BC Managed Care – PPO | Attending: Emergency Medicine | Admitting: Emergency Medicine

## 2022-01-29 DIAGNOSIS — R1031 Right lower quadrant pain: Secondary | ICD-10-CM | POA: Diagnosis present

## 2022-01-29 HISTORY — DX: Unspecified appendicitis: K37

## 2022-01-29 LAB — CBC
HCT: 45.8 % (ref 39.0–52.0)
Hemoglobin: 14.8 g/dL (ref 13.0–17.0)
MCH: 28.6 pg (ref 26.0–34.0)
MCHC: 32.3 g/dL (ref 30.0–36.0)
MCV: 88.4 fL (ref 80.0–100.0)
Platelets: 266 10*3/uL (ref 150–400)
RBC: 5.18 MIL/uL (ref 4.22–5.81)
RDW: 12.7 % (ref 11.5–15.5)
WBC: 9.8 10*3/uL (ref 4.0–10.5)
nRBC: 0 % (ref 0.0–0.2)

## 2022-01-29 LAB — COMPREHENSIVE METABOLIC PANEL
ALT: 47 U/L — ABNORMAL HIGH (ref 0–44)
AST: 29 U/L (ref 15–41)
Albumin: 4.2 g/dL (ref 3.5–5.0)
Alkaline Phosphatase: 64 U/L (ref 38–126)
Anion gap: 8 (ref 5–15)
BUN: 12 mg/dL (ref 6–20)
CO2: 26 mmol/L (ref 22–32)
Calcium: 9.3 mg/dL (ref 8.9–10.3)
Chloride: 104 mmol/L (ref 98–111)
Creatinine, Ser: 0.79 mg/dL (ref 0.61–1.24)
GFR, Estimated: >60 mL/min (ref >60)
Glucose, Bld: 92 mg/dL (ref 70–99)
Potassium: 4 mmol/L (ref 3.5–5.1)
Sodium: 138 mmol/L (ref 135–145)
Total Bilirubin: 0.5 mg/dL (ref 0.3–1.2)
Total Protein: 7.6 g/dL (ref 6.5–8.1)

## 2022-01-29 LAB — LIPASE, BLOOD: Lipase: 28 U/L (ref 11–51)

## 2022-01-29 MED ORDER — AMOXICILLIN-POT CLAVULANATE 875-125 MG PO TABS
1.0000 | ORAL_TABLET | Freq: Once | ORAL | Status: AC
  Administered 2022-01-29: 1 via ORAL
  Filled 2022-01-29: qty 1

## 2022-01-29 MED ORDER — ONDANSETRON 4 MG PO TBDP
4.0000 mg | ORAL_TABLET | Freq: Once | ORAL | Status: AC
  Administered 2022-01-29: 4 mg via ORAL
  Filled 2022-01-29: qty 1

## 2022-01-29 MED ORDER — ONDANSETRON 4 MG PO TBDP: 4.0000 mg | ORAL_TABLET | Freq: Three times a day (TID) | ORAL | 0 refills | Status: DC | PRN

## 2022-01-29 MED ORDER — AMOXICILLIN-POT CLAVULANATE 875-125 MG PO TABS: 1.0000 | ORAL_TABLET | Freq: Two times a day (BID) | ORAL | 0 refills | Status: DC

## 2022-01-29 MED ORDER — IOHEXOL 300 MG/ML  SOLN
125.0000 mL | Freq: Once | INTRAMUSCULAR | Status: AC | PRN
  Administered 2022-01-29: 100 mL via INTRAVENOUS
  Filled 2022-01-29: qty 125

## 2022-01-29 NOTE — ED Provider Notes (Signed)
? ?[facility] ?Provider Note ? ? ? Event Date/Time  ? First MD Initiated Contact with Patient 01/29/22 1700   ?  (approximate) ? ? ?History  ? ?Chief Complaint ?Abdominal Pain and Nausea ? ? ?HPI ?Nicholas Long is a 31 y.o. male, history of anxiety, depression, morbid obesity, OSA, presents to the emergency department for evaluation of abdominal pain.  Patient states that he began experiencing pain in his right lower quadrant as well as mid upper abdomen at around 11 AM today.  He states that it is not hurting him a lot now, but states it is uncomfortable.  Of note, patient had a ruptured appendix approximately 1 month prior where he was treated conservatively with antibiotics.  Since then he has not had any notable complications.  Reports some nausea that has since resolved.  Denies fever/chills, chest pain, shortness of breath, vomiting, diarrhea, urinary symptoms, vision changes, hearing changes, rash/lesions, or numbness/tingling in upper or lower extremities. ? ?History Limitations: No limitations ? ?    ? ? ?Physical Exam  ?Triage Vital Signs: ?ED Triage Vitals  ?Enc Vitals Group  ?   BP 01/29/22 1601 127/70  ?   Pulse Rate 01/29/22 1601 96  ?   Resp 01/29/22 1601 18  ?   Temp 01/29/22 1601 98.9 ?F (37.2 ?C)  ?   Temp Source 01/29/22 1601 Oral  ?   SpO2 01/29/22 1601 98 %  ?   Weight 01/29/22 1603 (!) 385 lb 12.9 oz (175 kg)  ?   Height 01/29/22 1603 6' (1.829 m)  ?   Head Circumference --   ?   Peak Flow --   ?   Pain Score 01/29/22 1602 3  ?   Pain Loc --   ?   Pain Edu? --   ?   Excl. in GC? --   ? ? ?Most recent vital signs: ?Vitals:  ? 01/29/22 2032 01/29/22 2111  ?BP: 125/78 126/79  ?Pulse:  85  ?Resp: 17 19  ?Temp: 98.9 ?F (37.2 ?C)   ?SpO2: 100% 100%  ? ? ?General: Awake, NAD.  ?Skin: Warm, dry. No rashes or lesions.  ?Eyes: PERRL. Conjunctivae normal.  ?CV: Good peripheral perfusion.  ?Resp: Normal effort.  ?Abd: Soft, no distention.  Slight endorsement of tenderness with  deep palpation of the right lower quadrant, otherwise unremarkable abdominal exam. ?Neuro: At baseline. No gross neurological deficits.  ? ?Focused Exam: Not applicable ? ?Physical Exam ? ? ? ?ED Results / Procedures / Treatments  ?Labs ?(all labs ordered are listed, but only abnormal results are displayed) ?Labs Reviewed  ?COMPREHENSIVE METABOLIC PANEL - Abnormal; Notable for the following components:  ?    Result Value  ? ALT 47 (*)   ? All other components within normal limits  ?LIPASE, BLOOD  ?CBC  ? ? ? ?EKG ?Not applicable ? ? ?RADIOLOGY ? ?ED Provider Interpretation: I personally reviewed this CT scan, dilated structure in the right lower quadrant based on my interpretation ? ?CT Abdomen Pelvis W Contrast ? ?Result Date: 01/29/2022 ?CLINICAL DATA:  Pain right lower quadrant EXAM: CT ABDOMEN AND PELVIS WITH CONTRAST TECHNIQUE: Multidetector CT imaging of the abdomen and pelvis was performed using the standard protocol following bolus administration of intravenous contrast. RADIATION DOSE REDUCTION: This exam was performed according to the departmental dose-optimization program which includes automated exposure control, adjustment of the mA and/or kV according to patient size and/or use of iterative reconstruction technique. CONTRAST:  OMNIPAQUE  IOHEXOL 300 MG/ML  SOLN COMPARISON:  Previous studies including the examination of 12/28/2021 FINDINGS: Lower chest: Unremarkable. Hepatobiliary: There is mild fatty infiltration. No focal abnormality is seen. Gallbladder is not seen. There is no significant dilation of bile ducts. Pancreas: There is fatty infiltration. No focal abnormality is seen. Spleen: Spleen measures 14.3 cm in maximum diameter. Adrenals/Urinary Tract: Adrenals are unremarkable. There is no hydronephrosis. There are no renal or ureteral stones. Urinary bladder is not distended. Stomach/Bowel: Stomach is unremarkable. Small bowel loops are not dilated. There is a tubular structure measuring 10  mm in diameter in the expected location of the appendix. There is stranding in the pericecal fat. There is no loculated fluid collection. There is interval decrease in inflammatory phlegmon in the pericecal region in comparison with the previous study. There is no significant wall thickening in colon. Vascular/Lymphatic: Vascular structures are unremarkable. There are subcentimeter nodes in the mesentery with no significant interval change. Reproductive: Unremarkable. Other: There is no ascites or pneumoperitoneum. Small umbilical hernia containing fat is seen. Musculoskeletal: Unremarkable. IMPRESSION: There is interval decrease in inflammatory phlegmon in the pericecal region since 12/28/2021. Appendix appears to be dilated measuring 10 mm in diameter with pericecal stranding. Findings may suggest residual changes from previous appendicitis or recurrent acute appendicitis. There is no loculated pericecal fluid collection. There is no evidence of intestinal obstruction or pneumoperitoneum. There is no hydronephrosis. Enlarged spleen. Other findings as described in the body of the report. Electronically Signed   By: Ernie Avena M.D.   On: 01/29/2022 18:02   ? ?PROCEDURES: ? ?Critical Care performed: Not applicable ? ?Procedures ? ? ? ?MEDICATIONS ORDERED IN ED: ?Medications  ?iohexol (OMNIPAQUE) 300 MG/ML solution 125 mL (100 mLs Intravenous Contrast Given 01/29/22 1739)  ?amoxicillin-clavulanate (AUGMENTIN) 875-125 MG per tablet 1 tablet (1 tablet Oral Given 01/29/22 2026)  ?ondansetron (ZOFRAN-ODT) disintegrating tablet 4 mg (4 mg Oral Given 01/29/22 2040)  ? ? ? ?IMPRESSION / MDM / ASSESSMENT AND PLAN / ED COURSE  ?I reviewed the triage vital signs and the nursing notes. ?             ?               ? ?Differential diagnosis includes, but is not limited to, appendicitis, choledocholithiasis, peptic/gastric ulcers, GERD, anxiety, colitis, diverticulitis ? ?ED Course ?Patient appears well, vitals within  normal limits.  NAD.  Patient stated that he did not need any medicine for pain or nausea at this time. ? ?CBC shows no leukocytosis or anemia.  CMP unremarkable for transaminitis, kidney injury, or electrolyte abnormalities.  Lipase unremarkable at 28. ? ?CT scan shows dilation of the appendix with pericecal stranding.  Spoke with the on-call general surgeon, Dr. Claudine Mouton, who advised that patient is not likely experiencing an acute appendicitis given his history and physical exam, as well as his unremarkable lab work-up.  However did recommend initiating antibiotics at this time and following up with him in clinic tomorrow. ? ?Assessment/Plan ?Patient presents with right lower quadrant and mid upper abdominal pain.  It appears to have mostly resolved over the past few hours.  CT scan does show dilation of the appendix with pericecal stranding, however lab work-up does not suggest any acute appendicitis at this time.  We will provide empiric antibiotic coverage until he can be seen by general surgery tomorrow.  We will provide him with a prescription for ondansetron for nausea, as requested.  He states that he can manage pain at  home with tolerance ibuprofen.  We will plan to discharge ? ?Considered admission for this patient, but given his stable presentation, normal vitals, and unremarkable lab work-up, he is unlikely to benefit ? ?Provided the patient with anticipatory guidance, return precautions, and educational material. Encouraged the patient to return to the emergency department at any time if they begin to experience any new or worsening symptoms. Patient expressed understanding and agreed with the plan.  ? ?  ? ? ?FINAL CLINICAL IMPRESSION(S) / ED DIAGNOSES  ? ?Final diagnoses:  ?Right lower quadrant abdominal pain  ? ? ? ?Rx / DC Orders  ? ?ED Discharge Orders   ? ?      Ordered  ?  amoxicillin-clavulanate (AUGMENTIN) 875-125 MG tablet  2 times daily       ? 01/29/22 2011  ?  ondansetron (ZOFRAN-ODT) 4  MG disintegrating tablet  Every 8 hours PRN       ? 01/29/22 2011  ? ?  ?  ? ?  ? ? ? ?Note:  This document was prepared using Dragon voice recognition software and may include unintentional dictation er

## 2022-01-29 NOTE — Discharge Instructions (Addendum)
-  Please follow-up with the general surgeon listed above tomorrow. ?-Return to the emergency department anytime if you begin to experience any new or worsening symptoms. ?-Take all of your antibiotics as prescribed.  You may utilize ondansetron as needed for nausea. ?

## 2022-01-29 NOTE — ED Triage Notes (Signed)
Pt to ED for dull constant mid upper abdominal pain and also intermittent sharp RLQ abdominal pain, also nausea,  since about 11am today.  ? ?Pt states does not have gallbladder. States that appendix ruptured in March 2023 and was hospitalized and treated with abx but did not have surgery.  ? ?Pt called surgeon (Piscoya) who told him to come to ED for CT abdomen.  ?

## 2022-01-29 NOTE — ED Notes (Signed)
See triage note  presents with some upper abd discomfort  describes as "dull"  also has had some nausea  no vomiting or fever ?

## 2022-01-31 ENCOUNTER — Encounter: Payer: Self-pay | Admitting: Surgery

## 2022-01-31 ENCOUNTER — Ambulatory Visit: Payer: BC Managed Care – PPO | Admitting: Surgery

## 2022-01-31 VITALS — BP 122/66 | HR 83 | Temp 98.6°F | Ht 74.0 in | Wt 388.6 lb

## 2022-01-31 DIAGNOSIS — K3532 Acute appendicitis with perforation and localized peritonitis, without abscess: Secondary | ICD-10-CM

## 2022-01-31 DIAGNOSIS — K3533 Acute appendicitis with perforation and localized peritonitis, with abscess: Secondary | ICD-10-CM

## 2022-01-31 MED ORDER — CIPROFLOXACIN HCL 500 MG PO TABS: 500.0000 mg | ORAL_TABLET | Freq: Two times a day (BID) | ORAL | 0 refills | Status: AC

## 2022-01-31 MED ORDER — METRONIDAZOLE 500 MG PO TABS: 500.0000 mg | ORAL_TABLET | Freq: Two times a day (BID) | ORAL | 0 refills | Status: AC

## 2022-01-31 NOTE — Progress Notes (Signed)
?01/31/2022 ? ?History of Present Illness: ?Nicholas Long is a 31 y.o. male with history of perforated appendicitis with phlegmon, admitted on 3/20 and discharged on 3/24.  He had a repeat CT scan during his admission which showed phlegmon without developing abscess.  He was seen as outpatient on 4/19 for follow up and his pain had resolved and he was doing well.  However, on 4/24, he presented to the ED due to sudden onset of worsening pain in mid abdomen going to right lower quadrant.  His WBC was normal at 9.8, and had a CT scan showing much improved phlegmon, with dilated appendix with some stranding.  He was started again on Augmentin and discharged home.  Today, his pain is improved and denies any nausea, fevers, chills. ? ? ?Past Medical History: ?Past Medical History:  ?Diagnosis Date  ? Appendicitis   ? Gallbladder & bile duct stone with obstruction 02/06/2008  ?  ? ?Past Surgical History: ?Past Surgical History:  ?Procedure Laterality Date  ? CHOLECYSTECTOMY    ? GALLBLADDER SURGERY  02/06/2008  ? ? ?Home Medications: ?Prior to Admission medications   ?Medication Sig Start Date End Date Taking? Authorizing Provider  ?aspirin EC 81 MG tablet Take 81 mg by mouth daily. Swallow whole.   Yes [provider]  ?aspirin-acetaminophen-caffeine (EXCEDRIN MIGRAINE) 316 086 5259 MG tablet Take by mouth every 6 (six) hours as needed for headache.   Yes [provider]  ?ciprofloxacin (CIPRO) 500 MG tablet Take 1 tablet (500 mg total) by mouth 2 (two) times daily for 14 days. 01/31/22 02/14/22 Yes Jerrell Hart, Jacqulyn Bath, MD  ?metroNIDAZOLE (FLAGYL) 500 MG tablet Take 1 tablet (500 mg total) by mouth 2 (two) times daily for 14 days. 01/31/22 02/14/22 Yes Niyam Bisping, Jacqulyn Bath, MD  ?ondansetron (ZOFRAN-ODT) 4 MG disintegrating tablet Take 1 tablet (4 mg total) by mouth every 8 (eight) hours as needed for nausea or vomiting. 01/29/22  Yes Teodoro Spray, PA  ? ? ?Allergies: ?Allergies  ?Allergen Reactions  ?  Escitalopram   ?  Made jittery and caused sharp pain in head  ? Morphine And Related Other (See Comments)  ?  Burning in veins  ? ? ?Review of Systems: ?Review of Systems  ?Constitutional:  Negative for chills and fever.  ?Respiratory:  Negative for shortness of breath.   ?Cardiovascular:  Negative for chest pain.  ?Gastrointestinal:  Positive for abdominal pain and nausea. Negative for constipation, diarrhea and vomiting.  ?Skin:  Negative for rash.  ? ?Physical Exam ?BP 122/66   Pulse 83   Temp 98.6 ?F (37 ?C)   Ht 6' 2"  (1.88 m)   Wt (!) 388 lb 9.6 oz (176.3 kg)   SpO2 96%   BMI 49.89 kg/m?  ?CONSTITUTIONAL: No acute distress ?HEENT:  Normocephalic, atraumatic, extraocular motion intact. ?RESPIRATORY:  Normal respiratory effort without pathologic use of accessory muscles. ?CARDIOVASCULAR: Regular rhythm and rate. ?GI: The abdomen is soft, obese, non-distended, currently non-tender to palpation. ?NEUROLOGIC:  Motor and sensation is grossly normal.  Cranial nerves are grossly intact. ?PSYCH:  Alert and oriented to person, place and time. Affect is normal. ? ?Labs/Imaging: ?CT scan abdomen/pelvis 01/29/22: ?IMPRESSION: ?There is interval decrease in inflammatory phlegmon in the pericecal ?region since 12/28/2021. Appendix appears to be dilated measuring 10 ?mm in diameter with pericecal stranding. Findings may suggest residual changes from previous appendicitis or recurrent acute ?appendicitis. There is no loculated pericecal fluid collection. ?  ?There is no evidence of intestinal obstruction or pneumoperitoneum. ?There is no  hydronephrosis. ?  ?Enlarged spleen. ?  ?Other findings as described in the body of the report. ? ?Labs from 01/29/22: ?Na 138, K 4, Cl 104, CO2 26, BUN 12, Cr 0.79.  Total bili 0.5, AST 47, ALT 29, Alk Phos 64, albumin 4.2, lipase 28.  WBC 9.8, Hgb 14.8, Hct 45.8, Plt 266. ? ? ?Assessment and Plan: ?This is a 31 y.o. male with history of acute perforated appendicitis with phlegmon, with  recurrent symptom. ? ?--Discussed the patient's CT scan and labs from his ED visit and reviewed the images together.  Overall, there is significant improvement in the phlegmon he had during his previous admission.  There is some mild residual stranding, but unclear if it's persistent changes that have not improved yet or new changes from recurrence.  At this point, he's feeling better after starting antibiotics.  Discussed with the patient that potentially the Augmentin course he was discharged with was not the appropriate antibiotic and as a precaution, will change him to Cipro/Flagyl for 14 day course.   ?--If he continues to improve, then we can keep his appointment as scheduled for 03/30/22.  Return precautions given, particularly if the pain worsens, develops fevers, chills.   ?--Continue his current workup for TIA vs complex migraine.   ? ?I spent 25 minutes dedicated to the care of this patient on the date of this encounter to include pre-visit review of records, face-to-face time with the patient discussing diagnosis and management, and any post-visit coordination of care. ? ? ?Melvyn Neth, MD ?[facility] ? ? ?  ?

## 2022-01-31 NOTE — Patient Instructions (Addendum)
We want you to stop taking the Augmentin antibiotics. ?We have send in antibiotics for Flagyl and Cipro to be taken twice a day for 14 days. ? ?Call us if any of your symptoms return so we may direct your care. Follow up as scheduled in June.  ?We will cancel your CT scheduled for June for now.  ? ? ? ?

## 2022-02-01 ENCOUNTER — Ambulatory Visit (INDEPENDENT_AMBULATORY_CARE_PROVIDER_SITE_OTHER): Payer: BC Managed Care – PPO

## 2022-02-01 DIAGNOSIS — G459 Transient cerebral ischemic attack, unspecified: Secondary | ICD-10-CM

## 2022-02-01 DIAGNOSIS — R200 Anesthesia of skin: Secondary | ICD-10-CM | POA: Diagnosis not present

## 2022-02-01 LAB — ECHOCARDIOGRAM COMPLETE BUBBLE STUDY
AR max vel: 4.56 cm2
AV Area VTI: 4.37 cm2
AV Area mean vel: 4.37 cm2
AV Mean grad: 4 mmHg
AV Peak grad: 8.5 mmHg
Ao pk vel: 1.46 m/s
Area-P 1/2: 4.74 cm2
S' Lateral: 3.5 cm

## 2022-02-01 MED ORDER — PERFLUTREN LIPID MICROSPHERE
1.0000 mL | INTRAVENOUS | Status: AC | PRN
  Administered 2022-02-01: 2 mL via INTRAVENOUS

## 2022-02-08 ENCOUNTER — Other Ambulatory Visit (INDEPENDENT_AMBULATORY_CARE_PROVIDER_SITE_OTHER): Payer: BC Managed Care – PPO

## 2022-02-08 ENCOUNTER — Ambulatory Visit: Payer: BC Managed Care – PPO | Admitting: Registered Nurse

## 2022-02-08 ENCOUNTER — Encounter: Payer: Self-pay | Admitting: Registered Nurse

## 2022-02-08 ENCOUNTER — Other Ambulatory Visit: Payer: Self-pay

## 2022-02-08 VITALS — BP 134/70 | HR 72 | Temp 98.2°F | Resp 16 | Ht 74.0 in | Wt 391.0 lb

## 2022-02-08 DIAGNOSIS — E538 Deficiency of other specified B group vitamins: Secondary | ICD-10-CM | POA: Diagnosis not present

## 2022-02-08 DIAGNOSIS — E291 Testicular hypofunction: Secondary | ICD-10-CM | POA: Diagnosis not present

## 2022-02-08 LAB — COMPREHENSIVE METABOLIC PANEL
ALT: 82 U/L — ABNORMAL HIGH (ref 0–53)
AST: 33 U/L (ref 0–37)
Albumin: 4.1 g/dL (ref 3.5–5.2)
Alkaline Phosphatase: 66 U/L (ref 39–117)
BUN: 16 mg/dL (ref 6–23)
CO2: 25 mEq/L (ref 19–32)
Calcium: 9.1 mg/dL (ref 8.4–10.5)
Chloride: 106 mEq/L (ref 96–112)
Creatinine, Ser: 0.95 mg/dL (ref 0.40–1.50)
GFR: 105.83 mL/min (ref >60.00)
Glucose, Bld: 95 mg/dL (ref 70–99)
Potassium: 4.2 mEq/L (ref 3.5–5.1)
Sodium: 139 mEq/L (ref 135–145)
Total Bilirubin: 0.5 mg/dL (ref 0.2–1.2)
Total Protein: 6.7 g/dL (ref 6.0–8.3)

## 2022-02-08 LAB — CBC WITH DIFFERENTIAL/PLATELET
Basophils Absolute: 0.1 10*3/uL (ref 0.0–0.1)
Basophils Relative: 0.7 % (ref 0.0–3.0)
Eosinophils Absolute: 0.1 10*3/uL (ref 0.0–0.7)
Eosinophils Relative: 1.9 % (ref 0.0–5.0)
HCT: 42.5 % (ref 39.0–52.0)
Hemoglobin: 14.3 g/dL (ref 13.0–17.0)
Lymphocytes Relative: 21.6 % (ref 12.0–46.0)
Lymphs Abs: 1.7 10*3/uL (ref 0.7–4.0)
MCHC: 33.5 g/dL (ref 30.0–36.0)
MCV: 86.8 fl (ref 78.0–100.0)
Monocytes Absolute: 0.6 10*3/uL (ref 0.1–1.0)
Monocytes Relative: 7.2 % (ref 3.0–12.0)
Neutro Abs: 5.5 10*3/uL (ref 1.4–7.7)
Neutrophils Relative %: 68.6 % (ref 43.0–77.0)
Platelets: 232 10*3/uL (ref 150.0–400.0)
RBC: 4.9 Mil/uL (ref 4.22–5.81)
RDW: 13.7 % (ref 11.5–15.5)
WBC: 8 10*3/uL (ref 4.0–10.5)

## 2022-02-08 LAB — TESTOSTERONE: Testosterone: 194.74 ng/dL — ABNORMAL LOW (ref 300.00–890.00)

## 2022-02-08 LAB — VITAMIN D 25 HYDROXY (VIT D DEFICIENCY, FRACTURES): VITD: 18.08 ng/mL — ABNORMAL LOW (ref 30.00–100.00)

## 2022-02-08 LAB — B12 AND FOLATE PANEL
Folate: 7.5 ng/mL (ref >5.9)
Vitamin B-12: 202 pg/mL — ABNORMAL LOW (ref 211–911)

## 2022-02-08 LAB — TSH: TSH: 0.9 u[IU]/mL (ref 0.35–5.50)

## 2022-02-08 NOTE — Progress Notes (Signed)
? ?Established Patient Office Visit ? ?Subjective:  ?Patient ID: Nicholas Long, male    DOB: [birth year]  Age: 31 y.o. MRN: [id number] ? ?CC:  ?Chief Complaint  ?Patient presents with  ? Establish Care  ?  Pt here to establish care last pcp Malva Cogan, pt reports hxt of low testosterone wanted rechecked today   ? ? ?HPI ?Nicholas Long presents for TOC ? ?Pt of Malva Cogan.  ? ?Histories reviewed and updated with patient.  ? ?Low testosterone ?Hx of. Not currently supplementing. ?Does have some low energy ?Hx of B12 deficiency as well.  ?Would like general labs checked.  ? ?Outpatient Medications Prior to Visit  ?Medication Sig Dispense Refill  ? aspirin EC 81 MG tablet Take 81 mg by mouth daily. Swallow whole.    ? aspirin-acetaminophen-caffeine (EXCEDRIN MIGRAINE) 250-250-65 MG tablet Take by mouth every 6 (six) hours as needed for headache.    ? ciprofloxacin (CIPRO) 500 MG tablet Take 1 tablet (500 mg total) by mouth 2 (two) times daily for 14 days. 28 tablet 0  ? metroNIDAZOLE (FLAGYL) 500 MG tablet Take 1 tablet (500 mg total) by mouth 2 (two) times daily for 14 days. 28 tablet 0  ? ondansetron (ZOFRAN-ODT) 4 MG disintegrating tablet Take 1 tablet (4 mg total) by mouth every 8 (eight) hours as needed for nausea or vomiting. 15 tablet 0  ? ?No facility-administered medications prior to visit.  ? ? ?Review of Systems  ?Constitutional: Negative.   ?HENT: Negative.    ?Eyes: Negative.   ?Respiratory: Negative.    ?Cardiovascular: Negative.   ?Gastrointestinal: Negative.   ?Endocrine: Negative.   ?Genitourinary: Negative.   ?Musculoskeletal: Negative.   ?Skin: Negative.   ?Allergic/Immunologic: Negative.   ?Neurological: Negative.   ?Hematological: Negative.   ?Psychiatric/Behavioral: Negative.    ?All other systems reviewed and are negative. ? ?  ?Objective:  ?  ? ?BP 134/70   Pulse 72   Temp 98.2 ?F (36.8 ?C) (Temporal)   Resp 16   Ht 6\' 2"  (1.88 m)   Wt (!) 391 lb (177.4 kg)   SpO2 97%   BMI  50.20 kg/m?  ? ?Wt Readings from Last 3 Encounters:  ?02/08/22 (!) 391 lb (177.4 kg)  ?01/31/22 (!) 388 lb 9.6 oz (176.3 kg)  ?01/29/22 (!) 385 lb 12.9 oz (175 kg)  ? ?Physical Exam ?Constitutional:   ?   General: He is not in acute distress. ?   Appearance: Normal appearance. He is normal weight. He is not ill-appearing, toxic-appearing or diaphoretic.  ?Cardiovascular:  ?   Rate and Rhythm: Normal rate and regular rhythm.  ?   Heart sounds: Normal heart sounds. No murmur heard. ?  No friction rub. No gallop.  ?Pulmonary:  ?   Effort: Pulmonary effort is normal. No respiratory distress.  ?   Breath sounds: Normal breath sounds. No stridor. No wheezing, rhonchi or rales.  ?Chest:  ?   Chest wall: No tenderness.  ?Neurological:  ?   General: No focal deficit present.  ?   Mental Status: He is alert and oriented to person, place, and time. Mental status is at baseline.  ?Psychiatric:     ?   Mood and Affect: Mood normal.     ?   Behavior: Behavior normal.     ?   Thought Content: Thought content normal.     ?   Judgment: Judgment normal.  ? ? ?No results found for any visits on 02/08/22. ? ? ? ?  The ASCVD Risk score (Arnett DK, et al., 2019) failed to calculate for the following reasons: ?  The 2019 ASCVD risk score is only valid for ages 37 to 55 ? ?  ?Assessment & Plan:  ? ?Problem List Items Addressed This Visit   ? ?  ? Endocrine  ? 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 Relevant Orders  ? CBC with Differential/Platelet  ? TSH  ? Comprehensive metabolic panel  ? Vitamin D (25 hydroxy)  ? ?Other Visit Diagnoses   ? ? B12 deficiency      ? Relevant Orders  ? CBC with Differential/Platelet  ? TSH  ? Comprehensive metabolic panel  ? B12 and Folate Panel  ? Vitamin D (25 hydroxy)  ? ?  ? ? ?No orders of the defined types were placed in this encounter. ? ? ?Return in about 3 months (around 05/11/2022) for CPE and labs.  ? ?PLAN ?Labs collected. Will follow up with the patient as warranted. ?Recheck in 3 mo with CPE ?Patient  encouraged to call clinic with any questions, comments, or concerns. ? ? ?Janeece Agee, NP ?

## 2022-02-08 NOTE — Patient Instructions (Signed)
Mr. Nicholas Long - ? ?Great to meet you! ? ?Let's check labs ? ?Call if you need anything! ? ?Thanks, ? ?Rich  ?

## 2022-02-12 ENCOUNTER — Other Ambulatory Visit: Payer: Self-pay | Admitting: Registered Nurse

## 2022-02-12 ENCOUNTER — Encounter: Payer: Self-pay | Admitting: Registered Nurse

## 2022-02-12 DIAGNOSIS — E559 Vitamin D deficiency, unspecified: Secondary | ICD-10-CM

## 2022-02-12 DIAGNOSIS — E291 Testicular hypofunction: Secondary | ICD-10-CM

## 2022-02-12 MED ORDER — VITAMIN D (ERGOCALCIFEROL) 1.25 MG (50000 UNIT) PO CAPS: 50000.0000 [IU] | ORAL_CAPSULE | ORAL | 0 refills | Status: DC

## 2022-02-12 MED ORDER — TESTOSTERONE 20.25 MG/ACT (1.62%) TD GEL: 1.0000 "application " | Freq: Every day | TRANSDERMAL | 1 refills | Status: DC

## 2022-02-13 ENCOUNTER — Encounter: Payer: Self-pay | Admitting: Diagnostic Neuroimaging

## 2022-02-13 NOTE — Telephone Encounter (Signed)
Patient is calling in stating insurance is requiring a PA.  ?

## 2022-03-21 ENCOUNTER — Ambulatory Visit: Payer: BC Managed Care – PPO

## 2022-03-30 ENCOUNTER — Ambulatory Visit: Payer: BC Managed Care – PPO | Admitting: Surgery

## 2022-03-30 ENCOUNTER — Other Ambulatory Visit: Payer: Self-pay

## 2022-03-30 ENCOUNTER — Telehealth: Payer: Self-pay | Admitting: Surgery

## 2022-03-30 ENCOUNTER — Encounter: Payer: Self-pay | Admitting: Surgery

## 2022-03-30 VITALS — BP 134/84 | HR 83 | Temp 98.9°F | Ht 74.0 in | Wt 397.4 lb

## 2022-03-30 DIAGNOSIS — K3532 Acute appendicitis with perforation and localized peritonitis, without abscess: Secondary | ICD-10-CM

## 2022-03-30 DIAGNOSIS — Z13228 Encounter for screening for other metabolic disorders: Secondary | ICD-10-CM | POA: Insufficient documentation

## 2022-03-30 NOTE — Progress Notes (Signed)
03/30/2022  History of Present Illness: Nicholas Long is a 31 y.o. male presenting for follow up of perforated appendicitis with phlegmon.  He was in the hospital between 3/20 and 3/24 for this.  He had a repeat CT scan in the ED on 4/24 for RLQ pain, but CT scan showed improvement in the phelgmon and inflammatory changes.  Today, patient reports that he's doing well, denies any abdominal pain, nausea, vomiting, fevers, or chills.  Has not had further issues with the possible TIA and reports that his workup was negative.  Currently the thought process is more related to migraines.    Past Medical History: Past Medical History:  Diagnosis Date   Appendicitis    Gallbladder & bile duct stone with obstruction 02/06/2008   Low testosterone      Past Surgical History: Past Surgical History:  Procedure Laterality Date   APPENDECTOMY     CHOLECYSTECTOMY     GALLBLADDER SURGERY  02/06/2008    Home Medications: Prior to Admission medications   Medication Sig Start Date End Date Taking? Authorizing Provider  aspirin EC 81 MG tablet Take 81 mg by mouth daily. Swallow whole.   Yes [provider]  aspirin-acetaminophen-caffeine (EXCEDRIN MIGRAINE) [phone] MG tablet Take by mouth every 6 (six) hours as needed for headache.   Yes [provider]  Testosterone (ANDROGEL PUMP) 20.25 MG/ACT (1.62%) GEL Place 1 application. onto the skin daily. 02/12/22  Yes Maximiano Coss, NP  Vitamin D, Ergocalciferol, (DRISDOL) 1.25 MG (50000 UNIT) CAPS capsule Take 1 capsule (50,000 Units total) by mouth every 7 (seven) days. 02/12/22  Yes Maximiano Coss, NP    Allergies: Allergies  Allergen Reactions   Escitalopram     Made jittery and caused sharp pain in head   Morphine And Related Other (See Comments)    Burning in veins    Review of Systems: Review of Systems  Constitutional:  Negative for chills and fever.  HENT:  Negative for hearing loss.   Respiratory:  Negative for  shortness of breath.   Cardiovascular:  Negative for chest pain.  Gastrointestinal:  Negative for abdominal pain, nausea and vomiting.  Genitourinary:  Negative for dysuria.  Musculoskeletal:  Negative for myalgias.  Skin:  Negative for rash.  Neurological:  Negative for dizziness.  Psychiatric/Behavioral:  Negative for depression.     Physical Exam BP 134/84   Pulse 83   Temp 98.9 F (37.2 C) (Oral)   Ht 6' 2"  (1.88 m)   Wt (!) 397 lb 6.4 oz (180.3 kg)   SpO2 98%   BMI 51.02 kg/m  CONSTITUTIONAL: No acute distress HEENT:  Normocephalic, atraumatic, extraocular motion intact. NECK:  No jugular venous distention, trachea is midline. RESPIRATORY:  Lungs are clear, and breath sounds are equal bilaterally. Normal respiratory effort without pathologic use of accessory muscles. CARDIOVASCULAR: Heart is regular without murmurs, gallops, or rubs. GI: The abdomen is soft, non-distended, non-tender to palpation.  Has well healed RUQ incision from open cholecystectomy.  MUSCULOSKELETAL:  Normal gait, no peripheral edema. NEUROLOGIC:  Motor and sensation is grossly normal.  Cranial nerves are grossly intact. PSYCH:  Alert and oriented to person, place and time. Affect is normal.  Labs/Imaging: Labs from 02/08/22: Na 139, K 4.2, Cl 106, CO2 25, BUN 16, Cr 0.95.  Total bili 0.5, AST 33, ALT 82, Alk Phos 66, Albumin 4.1  WBC 8, Hgb 14.3, Hct 42.5, Plt 232.  CT abdomen/pelvis 01/29/22: IMPRESSION: There is interval decrease in inflammatory phlegmon in  the pericecal region since 12/28/2021. Appendix appears to be dilated measuring 10 mm in diameter with pericecal stranding. Findings may suggest residual changes from previous appendicitis or recurrent acute appendicitis. There is no loculated pericecal fluid collection.   There is no evidence of intestinal obstruction or pneumoperitoneum. There is no hydronephrosis.   Enlarged spleen.   Other findings as described in the body of the  report.  Assessment and Plan: This is a 31 y.o. male with history of perforated appendicitis.  --Discussed with the patient that now it's been 3 months since his episode and the inflammatory changes and scarring should be much improved in order to proceed more safely with a laparoscopic appendectomy.  He's in agreement with proceeding with surgery.  Discussed with him my plan for laparoscopic appendectomy and reviewed with him the surgery at length including the incisions, risks of bleeding, infection, injury to surrounding structures, that this would be an outpatient surgery, post-operative activity restrictions, pain control, and he's willing to proceed. --Will schedule the patient for 04/09/22.  Discussed holding his Aspirin before surgery, with last dose on 04/03/22. --All of his questions have been answered.  I spent 40 minutes dedicated to the care of this patient on the date of this encounter to include pre-visit review of records, face-to-face time with the patient discussing diagnosis and management, and any post-visit coordination of care.   Melvyn Neth, [facility]

## 2022-03-30 NOTE — H&P (View-Only) (Signed)
03/30/2022  History of Present Illness: Nicholas Long is a 31 y.o. male presenting for follow up of perforated appendicitis with phlegmon.  He was in the hospital between 3/20 and 3/24 for this.  He had a repeat CT scan in the ED on 4/24 for RLQ pain, but CT scan showed improvement in the phelgmon and inflammatory changes.  Today, patient reports that he's doing well, denies any abdominal pain, nausea, vomiting, fevers, or chills.  Has not had further issues with the possible TIA and reports that his workup was negative.  Currently the thought process is more related to migraines.    Past Medical History: Past Medical History:  Diagnosis Date   Appendicitis    Gallbladder & bile duct stone with obstruction 02/06/2008   Low testosterone      Past Surgical History: Past Surgical History:  Procedure Laterality Date   APPENDECTOMY     CHOLECYSTECTOMY     GALLBLADDER SURGERY  02/06/2008    Home Medications: Prior to Admission medications   Medication Sig Start Date End Date Taking? Authorizing Provider  aspirin EC 81 MG tablet Take 81 mg by mouth daily. Swallow whole.   Yes [provider]  aspirin-acetaminophen-caffeine (EXCEDRIN MIGRAINE) [phone] MG tablet Take by mouth every 6 (six) hours as needed for headache.   Yes [provider]  Testosterone (ANDROGEL PUMP) 20.25 MG/ACT (1.62%) GEL Place 1 application. onto the skin daily. 02/12/22  Yes Maximiano Coss, NP  Vitamin D, Ergocalciferol, (DRISDOL) 1.25 MG (50000 UNIT) CAPS capsule Take 1 capsule (50,000 Units total) by mouth every 7 (seven) days. 02/12/22  Yes Maximiano Coss, NP    Allergies: Allergies  Allergen Reactions   Escitalopram     Made jittery and caused sharp pain in head   Morphine And Related Other (See Comments)    Burning in veins    Review of Systems: Review of Systems  Constitutional:  Negative for chills and fever.  HENT:  Negative for hearing loss.   Respiratory:  Negative for  shortness of breath.   Cardiovascular:  Negative for chest pain.  Gastrointestinal:  Negative for abdominal pain, nausea and vomiting.  Genitourinary:  Negative for dysuria.  Musculoskeletal:  Negative for myalgias.  Skin:  Negative for rash.  Neurological:  Negative for dizziness.  Psychiatric/Behavioral:  Negative for depression.     Physical Exam BP 134/84   Pulse 83   Temp 98.9 F (37.2 C) (Oral)   Ht 6' 2"  (1.88 m)   Wt (!) 397 lb 6.4 oz (180.3 kg)   SpO2 98%   BMI 51.02 kg/m  CONSTITUTIONAL: No acute distress HEENT:  Normocephalic, atraumatic, extraocular motion intact. NECK:  No jugular venous distention, trachea is midline. RESPIRATORY:  Lungs are clear, and breath sounds are equal bilaterally. Normal respiratory effort without pathologic use of accessory muscles. CARDIOVASCULAR: Heart is regular without murmurs, gallops, or rubs. GI: The abdomen is soft, non-distended, non-tender to palpation.  Has well healed RUQ incision from open cholecystectomy.  MUSCULOSKELETAL:  Normal gait, no peripheral edema. NEUROLOGIC:  Motor and sensation is grossly normal.  Cranial nerves are grossly intact. PSYCH:  Alert and oriented to person, place and time. Affect is normal.  Labs/Imaging: Labs from 02/08/22: Na 139, K 4.2, Cl 106, CO2 25, BUN 16, Cr 0.95.  Total bili 0.5, AST 33, ALT 82, Alk Phos 66, Albumin 4.1  WBC 8, Hgb 14.3, Hct 42.5, Plt 232.  CT abdomen/pelvis 01/29/22: IMPRESSION: There is interval decrease in inflammatory phlegmon in  the pericecal region since 12/28/2021. Appendix appears to be dilated measuring 10 mm in diameter with pericecal stranding. Findings may suggest residual changes from previous appendicitis or recurrent acute appendicitis. There is no loculated pericecal fluid collection.   There is no evidence of intestinal obstruction or pneumoperitoneum. There is no hydronephrosis.   Enlarged spleen.   Other findings as described in the body of the  report.  Assessment and Plan: This is a 31 y.o. male with history of perforated appendicitis.  --Discussed with the patient that now it's been 3 months since his episode and the inflammatory changes and scarring should be much improved in order to proceed more safely with a laparoscopic appendectomy.  He's in agreement with proceeding with surgery.  Discussed with him my plan for laparoscopic appendectomy and reviewed with him the surgery at length including the incisions, risks of bleeding, infection, injury to surrounding structures, that this would be an outpatient surgery, post-operative activity restrictions, pain control, and he's willing to proceed. --Will schedule the patient for 04/09/22.  Discussed holding his Aspirin before surgery, with last dose on 04/03/22. --All of his questions have been answered.  I spent 40 minutes dedicated to the care of this patient on the date of this encounter to include pre-visit review of records, face-to-face time with the patient discussing diagnosis and management, and any post-visit coordination of care.   Melvyn Neth, [facility]

## 2022-04-04 ENCOUNTER — Other Ambulatory Visit: Payer: Self-pay | Admitting: Surgery

## 2022-04-04 ENCOUNTER — Encounter
Admission: RE | Admit: 2022-04-04 | Discharge: 2022-04-04 | Disposition: A | Discharge status: Home / Self Care | Payer: BC Managed Care – PPO | Source: Ambulatory Visit | Attending: Surgery | Admitting: Surgery

## 2022-04-04 HISTORY — DX: Sleep apnea, unspecified: G47.30

## 2022-04-04 NOTE — Patient Instructions (Signed)
Your procedure is scheduled on: Monday April 09, 2022. Report to Day Surgery inside [facility] 2nd floor, stop by admissions desk before getting on elevator. To find out your arrival time please call [phone] between 1PM - 3PM on Friday April 06, 2022.  Remember: Instructions that are not followed completely may result in serious medical risk,  up to and including death, or upon the discretion of your surgeon and anesthesiologist your  surgery may need to be rescheduled.     _X__ 1. Do not eat food after midnight the night before your procedure.                 No chewing gum or hard candies. You may drink clear liquids up to 2 hours                 before you are scheduled to arrive for your surgery- DO not drink clear                 liquids within 2 hours of the start of your surgery.                 Clear Liquids include:  water  __X__2.  On the morning of surgery brush your teeth with toothpaste and water, you                may rinse your mouth with mouthwash if you wish.  Do not swallow any toothpaste or mouthwash.     _X__ 3.  No Alcohol for 24 hours before or after surgery.   _X__ 4.  Do Not Smoke or use e-cigarettes For 24 Hours Prior to Your Surgery.                 Do not use any chewable tobacco products for at least 6 hours prior to                 Surgery.  _X__  5.  Do not use any recreational drugs (marijuana, cocaine, heroin, ecstasy, MDMA or other)                For at least one week prior to your surgery.  Combination of these drugs with anesthesia                May have life threatening results.  ____  6.  Bring all medications with you on the day of surgery if instructed.   __X__  7.  Notify your doctor if there is any change in your medical condition      (cold, fever, infections).     Do not wear jewelry, make-up, hairpins, clips or nail polish. Do not wear lotions, powders, or perfumes. You may wear deodorant. Do not shave 48  hours prior to surgery.  Do not bring valuables to the hospital.    [facility] is not responsible for any belongings or valuables.  Contacts, dentures or bridgework may not be worn into surgery. Leave your suitcase in the car. After surgery it may be brought to your room. For patients admitted to the hospital, discharge time is determined by your treatment team.   Patients discharged the day of surgery will not be allowed to drive home.   Make arrangements for someone to be with you for the first 24 hours of your Same Day Discharge.   __X__ Take these medicines the morning of surgery with A SIP OF WATER:    1. None   2.  3.   4.  5.  6.  ____ Fleet Enema (as directed)   __X__ Use CHG Soap (or wipes) as directed  ____ Use Benzoyl Peroxide Gel as instructed  ____ Use inhalers on the day of surgery  ____ Stop metformin 2 days prior to surgery    ____ Take 1/2 of usual insulin dose the night before surgery. No insulin the morning          of surgery.   __X__ Stop aspirin 81 mg 04/03/22 as instructed by your doctor before your surgery.   __X__ One Week prior to surgery- Stop Anti-inflammatories such as Ibuprofen, Aleve, Advil, Motrin, meloxicam (MOBIC), diclofenac, etodolac, ketorolac, Toradol, Daypro, piroxicam, Goody's or BC powders. OK TO USE TYLENOL IF NEEDED   __X__ Stop supplements until after surgery.    ____ Bring C-Pap to the hospital.    If you have any questions regarding your pre-procedure instructions,  Please call Pre-admit Testing at [phone]

## 2022-04-09 ENCOUNTER — Ambulatory Visit: Payer: BC Managed Care – PPO | Admitting: Certified Registered"

## 2022-04-09 ENCOUNTER — Other Ambulatory Visit: Payer: Self-pay

## 2022-04-09 ENCOUNTER — Encounter: Payer: Self-pay | Admitting: Surgery

## 2022-04-09 ENCOUNTER — Encounter
Admission: RE | Disposition: A | Discharge status: Home / Self Care | Payer: Self-pay | Source: Home / Self Care | Attending: Surgery

## 2022-04-09 ENCOUNTER — Ambulatory Visit
Admission: RE | Admit: 2022-04-09 | Discharge: 2022-04-09 | Disposition: A | Discharge status: Home / Self Care | Payer: BC Managed Care – PPO | Attending: Surgery | Admitting: Surgery

## 2022-04-09 DIAGNOSIS — N365 Urethral false passage: Secondary | ICD-10-CM | POA: Diagnosis not present

## 2022-04-09 DIAGNOSIS — G4730 Sleep apnea, unspecified: Secondary | ICD-10-CM | POA: Diagnosis not present

## 2022-04-09 DIAGNOSIS — K3532 Acute appendicitis with perforation and localized peritonitis, without abscess: Secondary | ICD-10-CM | POA: Diagnosis not present

## 2022-04-09 DIAGNOSIS — Z6841 Body Mass Index (BMI) 40.0 and over, adult: Secondary | ICD-10-CM | POA: Insufficient documentation

## 2022-04-09 DIAGNOSIS — N9981 Other intraoperative complications of genitourinary system: Secondary | ICD-10-CM | POA: Insufficient documentation

## 2022-04-09 HISTORY — PX: CYSTOSCOPY: SHX5120

## 2022-04-09 HISTORY — PX: LAPAROSCOPIC APPENDECTOMY: SHX408

## 2022-04-09 SURGERY — APPENDECTOMY, LAPAROSCOPIC
Anesthesia: General

## 2022-04-09 MED ORDER — FENTANYL CITRATE (PF) 100 MCG/2ML IJ SOLN
INTRAMUSCULAR | Status: DC | PRN
  Administered 2022-04-09 (×2): 50 ug via INTRAVENOUS

## 2022-04-09 MED ORDER — 0.9 % SODIUM CHLORIDE (POUR BTL) OPTIME
TOPICAL | Status: DC | PRN
  Administered 2022-04-09: 150 mL

## 2022-04-09 MED ORDER — FAMOTIDINE 20 MG PO TABS
20.0000 mg | ORAL_TABLET | Freq: Once | ORAL | Status: AC
  Administered 2022-04-09: 20 mg via ORAL

## 2022-04-09 MED ORDER — ACETAMINOPHEN 500 MG PO TABS: 1000.0000 mg | ORAL_TABLET | Freq: Four times a day (QID) | ORAL | Status: DC | PRN

## 2022-04-09 MED ORDER — ACETAMINOPHEN 500 MG PO TABS
1000.0000 mg | ORAL_TABLET | ORAL | Status: AC
  Administered 2022-04-09: 1000 mg via ORAL

## 2022-04-09 MED ORDER — PROPOFOL 10 MG/ML IV BOLUS
INTRAVENOUS | Status: DC | PRN
  Administered 2022-04-09: 50 mg via INTRAVENOUS
  Administered 2022-04-09: 300 mg via INTRAVENOUS

## 2022-04-09 MED ORDER — CHLORHEXIDINE GLUCONATE 0.12 % MT SOLN
OROMUCOSAL | Status: AC
  Filled 2022-04-09: qty 15

## 2022-04-09 MED ORDER — CHLORHEXIDINE GLUCONATE CLOTH 2 % EX PADS
6.0000 | MEDICATED_PAD | Freq: Once | CUTANEOUS | Status: AC
  Administered 2022-04-09: 6 via TOPICAL

## 2022-04-09 MED ORDER — PROPOFOL 10 MG/ML IV BOLUS
INTRAVENOUS | Status: AC
  Filled 2022-04-09: qty 40

## 2022-04-09 MED ORDER — CHLORHEXIDINE GLUCONATE 0.12 % MT SOLN
15.0000 mL | Freq: Once | OROMUCOSAL | Status: AC
  Administered 2022-04-09: 15 mL via OROMUCOSAL

## 2022-04-09 MED ORDER — FAMOTIDINE 20 MG PO TABS
ORAL_TABLET | ORAL | Status: AC
  Filled 2022-04-09: qty 1

## 2022-04-09 MED ORDER — LACTATED RINGERS IV SOLN: INTRAVENOUS | Status: DC

## 2022-04-09 MED ORDER — SODIUM CHLORIDE 0.9 % IV SOLN
2.0000 g | INTRAVENOUS | Status: AC
  Administered 2022-04-09: 2 g via INTRAVENOUS

## 2022-04-09 MED ORDER — MIDAZOLAM HCL 2 MG/2ML IJ SOLN
INTRAMUSCULAR | Status: DC | PRN
  Administered 2022-04-09: 2 mg via INTRAVENOUS

## 2022-04-09 MED ORDER — SODIUM CHLORIDE 0.9 % IV SOLN
INTRAVENOUS | Status: AC
  Filled 2022-04-09: qty 2

## 2022-04-09 MED ORDER — KETOROLAC TROMETHAMINE 30 MG/ML IJ SOLN
INTRAMUSCULAR | Status: DC | PRN
  Administered 2022-04-09: 30 mg via INTRAVENOUS

## 2022-04-09 MED ORDER — SUGAMMADEX SODIUM 500 MG/5ML IV SOLN
INTRAVENOUS | Status: DC | PRN
  Administered 2022-04-09: 500 mg via INTRAVENOUS

## 2022-04-09 MED ORDER — DEXAMETHASONE SODIUM PHOSPHATE 10 MG/ML IJ SOLN
INTRAMUSCULAR | Status: DC | PRN
  Administered 2022-04-09: 4 mg via INTRAVENOUS

## 2022-04-09 MED ORDER — SODIUM CHLORIDE 0.9 % IR SOLN
Status: DC | PRN
  Administered 2022-04-09: 200 mL

## 2022-04-09 MED ORDER — ACETAMINOPHEN 500 MG PO TABS
ORAL_TABLET | ORAL | Status: AC
  Filled 2022-04-09: qty 2

## 2022-04-09 MED ORDER — ROCURONIUM BROMIDE 10 MG/ML (PF) SYRINGE
PREFILLED_SYRINGE | INTRAVENOUS | Status: AC
  Filled 2022-04-09: qty 10

## 2022-04-09 MED ORDER — GABAPENTIN 300 MG PO CAPS: 300.0000 mg | ORAL_CAPSULE | ORAL | Status: AC

## 2022-04-09 MED ORDER — FENTANYL CITRATE (PF) 100 MCG/2ML IJ SOLN
INTRAMUSCULAR | Status: AC
  Filled 2022-04-09: qty 2

## 2022-04-09 MED ORDER — ONDANSETRON HCL 4 MG/2ML IJ SOLN
INTRAMUSCULAR | Status: DC | PRN
  Administered 2022-04-09: 4 mg via INTRAVENOUS

## 2022-04-09 MED ORDER — MIDAZOLAM HCL 2 MG/2ML IJ SOLN
INTRAMUSCULAR | Status: AC
  Filled 2022-04-09: qty 2

## 2022-04-09 MED ORDER — BUPIVACAINE-EPINEPHRINE (PF) 0.5% -1:200000 IJ SOLN
INTRAMUSCULAR | Status: AC
  Filled 2022-04-09: qty 30

## 2022-04-09 MED ORDER — ROCURONIUM BROMIDE 100 MG/10ML IV SOLN
INTRAVENOUS | Status: DC | PRN
  Administered 2022-04-09: 20 mg via INTRAVENOUS
  Administered 2022-04-09: 10 mg via INTRAVENOUS
  Administered 2022-04-09: 70 mg via INTRAVENOUS

## 2022-04-09 MED ORDER — GABAPENTIN 300 MG PO CAPS
ORAL_CAPSULE | ORAL | Status: AC
  Administered 2022-04-09: 300 mg via ORAL
  Filled 2022-04-09: qty 1

## 2022-04-09 MED ORDER — LIDOCAINE HCL (CARDIAC) PF 100 MG/5ML IV SOSY
PREFILLED_SYRINGE | INTRAVENOUS | Status: DC | PRN
  Administered 2022-04-09: 60 mg via INTRAVENOUS

## 2022-04-09 MED ORDER — LIDOCAINE HCL (PF) 2 % IJ SOLN
INTRAMUSCULAR | Status: AC
  Filled 2022-04-09: qty 5

## 2022-04-09 MED ORDER — FENTANYL CITRATE (PF) 100 MCG/2ML IJ SOLN
25.0000 ug | INTRAMUSCULAR | Status: DC | PRN
  Administered 2022-04-09: 25 ug via INTRAVENOUS

## 2022-04-09 MED ORDER — IBUPROFEN 800 MG PO TABS: 800.0000 mg | ORAL_TABLET | Freq: Three times a day (TID) | ORAL | 1 refills | Status: DC | PRN

## 2022-04-09 MED ORDER — BUPIVACAINE-EPINEPHRINE (PF) 0.5% -1:200000 IJ SOLN
INTRAMUSCULAR | Status: DC | PRN
  Administered 2022-04-09: 30 mL via PERINEURAL

## 2022-04-09 MED ORDER — OXYCODONE HCL 5 MG PO TABS: 5.0000 mg | ORAL_TABLET | ORAL | 0 refills | Status: DC | PRN

## 2022-04-09 MED ORDER — PHENYLEPHRINE HCL (PRESSORS) 10 MG/ML IV SOLN
INTRAVENOUS | Status: DC | PRN
  Administered 2022-04-09: 80 ug via INTRAVENOUS

## 2022-04-09 MED ORDER — ONDANSETRON HCL 4 MG/2ML IJ SOLN: 4.0000 mg | Freq: Once | INTRAMUSCULAR | Status: DC | PRN

## 2022-04-09 MED ORDER — DEXMEDETOMIDINE HCL IN NACL 200 MCG/50ML IV SOLN
INTRAVENOUS | Status: DC | PRN
  Administered 2022-04-09: 12 ug via INTRAVENOUS
  Administered 2022-04-09: 8 ug via INTRAVENOUS

## 2022-04-09 MED ORDER — ORAL CARE MOUTH RINSE: 15.0000 mL | Freq: Once | OROMUCOSAL | Status: AC

## 2022-04-09 SURGICAL SUPPLY — 46 items
BAG URINE DRAIN 2000ML AR STRL (UROLOGICAL SUPPLIES) ×1 IMPLANT
CATH COUDE FOLEY 5CC 14FR (CATHETERS) ×1 IMPLANT
CATH FOLEY 2W COUNCIL 5CC 16FR (CATHETERS) ×1 IMPLANT
CUTTER FLEX LINEAR 45M (STAPLE) ×1 IMPLANT
DERMABOND ADVANCED (GAUZE/BANDAGES/DRESSINGS) ×1
DERMABOND ADVANCED .7 DNX12 (GAUZE/BANDAGES/DRESSINGS) ×2 IMPLANT
ELECT CAUTERY BLADE TIP 2.5 (TIP) ×3
ELECT REM PT RETURN 9FT ADLT (ELECTROSURGICAL) ×3
ELECTRODE CAUTERY BLDE TIP 2.5 (TIP) ×2 IMPLANT
ELECTRODE REM PT RTRN 9FT ADLT (ELECTROSURGICAL) ×2 IMPLANT
GAUZE SPONGE 4X4 12PLY STRL (GAUZE/BANDAGES/DRESSINGS) ×1 IMPLANT
GLOVE SURG SYN 7.0 (GLOVE) ×3 IMPLANT
GLOVE SURG SYN 7.0 PF PI (GLOVE) ×2 IMPLANT
GLOVE SURG SYN 7.5  E (GLOVE) ×1
GLOVE SURG SYN 7.5 E (GLOVE) ×2 IMPLANT
GLOVE SURG SYN 7.5 PF PI (GLOVE) ×2 IMPLANT
GOWN STRL REUS W/ TWL LRG LVL3 (GOWN DISPOSABLE) ×4 IMPLANT
GOWN STRL REUS W/TWL LRG LVL3 (GOWN DISPOSABLE) ×2
GUIDEWIRE STR DUAL SENSOR (WIRE) ×1 IMPLANT
IRRIGATION STRYKERFLOW (MISCELLANEOUS) IMPLANT
IRRIGATOR STRYKERFLOW (MISCELLANEOUS) ×3
IV NS 1000ML (IV SOLUTION) ×1
IV NS 1000ML BAXH (IV SOLUTION) IMPLANT
KIT TURNOVER KIT A (KITS) ×3 IMPLANT
LIGASURE LAP MARYLAND 5MM 37CM (ELECTROSURGICAL) ×1 IMPLANT
MANIFOLD NEPTUNE II (INSTRUMENTS) ×3 IMPLANT
NEEDLE HYPO 22GX1.5 SAFETY (NEEDLE) ×3 IMPLANT
NS IRRIG 500ML POUR BTL (IV SOLUTION) ×3 IMPLANT
PACK LAP CHOLECYSTECTOMY (MISCELLANEOUS) ×3 IMPLANT
PENCIL ELECTRO HAND CTR (MISCELLANEOUS) ×3 IMPLANT
RELOAD STAPLE 45 3.5 BLU ETS (ENDOMECHANICALS) IMPLANT
RELOAD STAPLE TA45 3.5 REG BLU (ENDOMECHANICALS) ×6 IMPLANT
SCISSORS METZENBAUM CVD 33 (INSTRUMENTS) ×3 IMPLANT
SET CYSTO W/LG BORE CLAMP LF (SET/KITS/TRAYS/PACK) ×1 IMPLANT
SUT MNCRL 4-0 (SUTURE) ×1
SUT MNCRL 4-0 27XMFL (SUTURE) ×2
SUT VICRYL 0 AB UR-6 (SUTURE) ×4 IMPLANT
SUTURE MNCRL 4-0 27XMF (SUTURE) ×2 IMPLANT
SYS BAG RETRIEVAL 10MM (BASKET) ×3
SYSTEM BAG RETRIEVAL 10MM (BASKET) ×2 IMPLANT
TRAY FOLEY MTR SLVR 16FR STAT (SET/KITS/TRAYS/PACK) ×3 IMPLANT
TROCAR BALLN GELPORT 12X130M (ENDOMECHANICALS) ×3 IMPLANT
TROCAR KII BLADELESS 5X150 (TROCAR) ×2 IMPLANT
TUBING EVAC SMOKE HEATED PNEUM (TUBING) ×3 IMPLANT
VALVE UROSEAL ADJ ENDO (VALVE) ×1 IMPLANT
WATER STERILE IRR 500ML POUR (IV SOLUTION) ×3 IMPLANT

## 2022-04-09 NOTE — Op Note (Signed)
  Procedure Date:  04/09/2022  Pre-operative Diagnosis:  Perforated appendicitis with phlegmon  Post-operative Diagnosis: Perforated appendicitis with phlegmon  Procedure:  Laparoscopic appendectomy  Surgeon:  Howie Ill, MD  Anesthesia:  General endotracheal  Estimated Blood Loss:  5 ml  Specimens:  appendix  Complications:  Patient had complication during Foley catheter placement, with the balloon being inflated likely within the urethra, and at the end of the case, the balloon would not deflate and the catheter would not come out.  Dr. Richardo Hanks came into the room to help with pulling the catheter out and performed cystoscopy and placed a new Foley catheter.  Please see his note for further details.  Indications for Procedure:  This is a 31 y.o. male with a history of prior perforated appendicitis with phlegmon in March 2023, now presenting for interval appendectomy.  The options of surgery versus observation were reviewed with the patient and/or family. The risks of bleeding, infection, recurrence of symptoms, negative laparoscopy, potential for an open procedure, bowel injury, abscess or infection, were all discussed with the patient and he was willing to proceed.  Description of Procedure: The patient was correctly identified in the preoperative area and brought into the operating room.  The patient was placed supine with VTE prophylaxis in place.  Appropriate time-outs were performed.  Anesthesia was induced and the patient was intubated.  Foley catheter was placed.  Appropriate antibiotics were infused.  The abdomen was prepped and draped in a sterile fashion. An infraumbilical incision was made. A cutdown technique was used to enter the abdominal cavity without injury, and a Hasson trocar was inserted.  Pneumoperitoneum was obtained with appropriate opening pressures.  Two 5-mm ports were placed in the suprapubic and left lateral positions under direct visualization.  The right  lower quadrant was inspected and the appendix was identified and found to have some inflammatory changes in the mid body and mesoappendix.  The appendix was carefully dissected.  The mesoappendix was divided using the LigaSure.  The base of the appendix was dissected out and divided with a standard load Endo GIA.  The appendix was placed in an Endocatch bag.  The right lower quadrant was then inspected again revealing an intact staple line, no bleeding, and no bowel injury.  The area was thoroughly irrigated.  The 5 mm ports were removed under direct visualization and the Hasson trocar was removed.  The Endocatch bag was brought out through the umbilical incision.  The fascial opening was closed using 0 vicryl suture.  Local anesthetic was infused in all incisions and the incisions were closed with 4-0 Monocryl.  The wounds were cleaned and sealed with DermaBond.  The balloon for the Foley catheter was not able to desufflate despite different attempts and even after cutting the tubing for the balloon.  The catheter also would not come out, and could not flush saline through it.  Dr. Richardo Hanks was called to come in for assistance.  Please see his operative note for further details.  A new Foley catheter was eventually placed, and the patient was emerged from anesthesia and extubated and brought to the recovery room for further management.  The patient tolerated the procedure well and all counts were correct at the end of the case.   Howie Ill, MD

## 2022-04-09 NOTE — Anesthesia Preprocedure Evaluation (Signed)
Anesthesia Evaluation  Patient identified by MRN, date of birth, ID band Patient awake    Reviewed: Allergy & Precautions, NPO status , Patient's Chart, lab work & pertinent test results  Airway Mallampati: III  TM Distance: >3 FB Neck ROM: Full    Dental  (+) Teeth Intact   Pulmonary neg pulmonary ROS, sleep apnea ,    Pulmonary exam normal  + decreased breath sounds      Cardiovascular Exercise Tolerance: Good negative cardio ROS Normal cardiovascular exam Rhythm:Regular     Neuro/Psych negative neurological ROS  negative psych ROS   GI/Hepatic negative GI ROS, Neg liver ROS,   Endo/Other  negative endocrine ROSMorbid obesity  Renal/GU negative Renal ROS  negative genitourinary   Musculoskeletal negative musculoskeletal ROS (+)   Abdominal (+) + obese,   Peds negative pediatric ROS (+)  Hematology negative hematology ROS (+)   Anesthesia Other Findings Past Medical History: No date: Appendicitis 02/06/2008: Gallbladder & bile duct stone with obstruction No date: Low testosterone No date: Sleep apnea  Past Surgical History: No date: APPENDECTOMY No date: CHOLECYSTECTOMY 02/06/2008: GALLBLADDER SURGERY  BMI    Body Mass Index: 50.72 kg/m      Reproductive/Obstetrics negative OB ROS                             Anesthesia Physical Anesthesia Plan  ASA: 3  Anesthesia Plan: General   Post-op Pain Management:    Induction: Intravenous  PONV Risk Score and Plan: Ondansetron, Dexamethasone, Midazolam and Treatment may vary due to age or medical condition  Airway Management Planned: Oral ETT  Additional Equipment:   Intra-op Plan:   Post-operative Plan: Extubation in OR  Informed Consent: I have reviewed the patients History and Physical, chart, labs and discussed the procedure including the risks, benefits and alternatives for the proposed anesthesia with the patient  or authorized representative who has indicated his/her understanding and acceptance.     Dental Advisory Given  Plan Discussed with: CRNA and Surgeon  Anesthesia Plan Comments:         Anesthesia Quick Evaluation

## 2022-04-09 NOTE — Anesthesia Procedure Notes (Signed)
Procedure Name: LMA Insertion Date/Time: 04/09/2022 11:14 AM  Performed by: Ryosuke Ericksen, CRNAPre-anesthesia Checklist: Patient identified, Patient being monitored, Timeout performed, Emergency Drugs available and Suction available Patient Re-evaluated:Patient Re-evaluated prior to induction Oxygen Delivery Method: Circle system utilized Preoxygenation: Pre-oxygenation with 100% oxygen Induction Type: IV induction Ventilation: Mask ventilation without difficulty LMA: LMA inserted LMA Size: 4.0 Tube type: Oral Number of attempts: 1 Placement Confirmation: positive ETCO2 and breath sounds checked- equal and bilateral Tube secured with: Tape Dental Injury: Teeth and Oropharynx as per pre-operative assessment

## 2022-04-09 NOTE — Discharge Instructions (Signed)
AMBULATORY SURGERY  DISCHARGE INSTRUCTIONS   The drugs that you were given will stay in your system until tomorrow so for the next 24 hours you should not:  Drive an automobile Make any legal decisions Drink any alcoholic beverage   You may resume regular meals tomorrow.  Today it is better to start with liquids and gradually work up to solid foods.  You may eat anything you prefer, but it is better to start with liquids, then soup and crackers, and gradually work up to solid foods.   Please notify your doctor immediately if you have any unusual bleeding, trouble breathing, redness and pain at the surgery site, drainage, fever, or pain not relieved by medication.       Please contact your physician with any problems or Same Day Surgery at [phone], Monday through Friday 6 am to 4 pm, or [place] [facility] [place] Main number at [phone].  

## 2022-04-09 NOTE — Anesthesia Postprocedure Evaluation (Signed)
Anesthesia Post Note  Patient: PRIYANSH PRY  Procedure(s) Performed: APPENDECTOMY LAPAROSCOPIC CYSTOSCOPY, REMOVAL OF URINARY CATHETER  Patient location during evaluation: PACU Anesthesia Type: General Level of consciousness: awake and oriented Pain management: satisfactory to patient Vital Signs Assessment: post-procedure vital signs reviewed and stable Respiratory status: spontaneous breathing and nonlabored ventilation Cardiovascular status: stable Anesthetic complications: no   No notable events documented.   Last Vitals:  Vitals:   04/09/22 1245 04/09/22 1301  BP: 107/66 108/61  Pulse: 64 72  Resp: 16 16  Temp: (!) 36.1 C (!) 36.3 C  SpO2: 94% 98%    Last Pain:  Vitals:   04/09/22 1301  TempSrc: Temporal  PainSc:                  VAN STAVEREN,Margi Edmundson

## 2022-04-09 NOTE — Interval H&P Note (Signed)
History and Physical Interval Note:  04/09/2022 7:56 AM  Nicholas Long  has presented today for surgery, with the diagnosis of Perforated appendicitis.  The various methods of treatment have been discussed with the patient and family. After consideration of risks, benefits and other options for treatment, the patient has consented to  Procedure(s): APPENDECTOMY LAPAROSCOPIC (N/A) as a surgical intervention.  The patient's history has been reviewed, patient examined, no change in status, stable for surgery.  I have reviewed the patient's chart and labs.  Questions were answered to the patient's satisfaction.     [name]

## 2022-04-09 NOTE — Op Note (Signed)
Date of procedure: 04/09/22  Preoperative diagnosis:  Unable to remove Foley catheter  Postoperative diagnosis:  Urethral false passage  Procedure: Foley removal Cystoscopy and Foley placement over wire  Surgeon: Nickolas Madrid, MD  Anesthesia: General  Complications: None  Intraoperative findings:  Foley removed with ~76m remaining in balloon On cystoscopy appeared to be a false passage in the bulbar urethra with some trauma from balloon inflation, prostate normal, 16 FPakistancouncil passed easily over a wire with yellow urine  EBL: Minimal  Specimens: None for urology portion  Drains: 16 FPakistancouncil Foley  Indication: Nicholas MOLLETTis a 31y.o. patient who is undergoing appendectomy with general surgery.  They were unable to remove the Foley at the end of the case and urology was consulted.  Apparently nursing was unable to place a 16 FPakistanFoley and placed a 14 FPakistanFoley prior to starting the procedure.  The Foley reportedly did not drain well during the case.   Description of procedure:  The patient was still intubated and in supine position.  When I arrived in the room the Foley was about halfway out of the penis, and the balloon port had already been cut.  I attempted to pass the back end of a sensor wire through the balloon port, but met resistance halfway through and was unable to advance this to the level of the balloon.  I was unable to aspirate any fluid through the cut end of the balloon port.  On exam, I could feel the catheter tracking down the urethra proximally, but was unable to palpate a balloon in the perineum.  Exam was limited secondary to his morbid obesity with BMI 51.  With a fair amount of force I was ultimately able to remove the catheter with the balloon intact and approximately 5 mL in the balloon.  He was prepped in standard sterile fashion, and I performed flexible cystoscopy to evaluate for any urethral or prostate trauma.  The urethra  was normal-appearing up until the proximal urethra where there appeared to be a false passage in the bulbar urethra where I suspect the balloon had been inflated.  I was able to navigate into the bladder easily with the cystoscope and the prostate was normal-appearing.  The bladder was full.  A sensor wire passed into the bladder easily.  A 16 FPakistancouncil passed easily over the wire into the bladder with return of yellow urine, and 10 mL were placed in the balloon.   Disposition: Stable to PACU  Plan: Voiding trial in 3 to 7 days in clinic, followed by 963-monthollow-up for PVR and symptom check  BrNickolas MadridMD

## 2022-04-09 NOTE — Anesthesia Procedure Notes (Signed)
Procedure Name: Intubation Date/Time: 04/09/2022 9:15 AM  Performed by: Patricio Popwell, CRNAPre-anesthesia Checklist: Patient identified, Patient being monitored, Timeout performed, Emergency Drugs available and Suction available Patient Re-evaluated:Patient Re-evaluated prior to induction Oxygen Delivery Method: Circle system utilized Preoxygenation: Pre-oxygenation with 100% oxygen Induction Type: IV induction Ventilation: Mask ventilation without difficulty Laryngoscope Size: Miller and 2 Grade View: Grade I Tube type: Oral Tube size: 7.5 mm Number of attempts: 1 Placement Confirmation: ETT inserted through vocal cords under direct vision, positive ETCO2 and breath sounds checked- equal and bilateral Secured at: 21 cm Tube secured with: Tape Dental Injury: Teeth and Oropharynx as per pre-operative assessment

## 2022-04-09 NOTE — Transfer of Care (Signed)
Immediate Anesthesia Transfer of Care Note  Patient: Nicholas Long  Procedure(s) Performed: APPENDECTOMY LAPAROSCOPIC CYSTOSCOPY, REMOVAL OF URINARY CATHETER  Patient Location: PACU  Anesthesia Type:General  Level of Consciousness: awake, alert  and oriented  Airway & Oxygen Therapy: Patient Spontanous Breathing and Patient connected to nasal cannula oxygen  Post-op Assessment: Report given to RN and Post -op Vital signs reviewed and stable  Post vital signs: Reviewed and stable  Last Vitals:  Vitals Value Taken Time  BP 77/62 04/09/22 1205  Temp    Pulse 79 04/09/22 1207  Resp 20 04/09/22 1207  SpO2 100 % 04/09/22 1207  Vitals shown include unvalidated device data.  Last Pain:  Vitals:   04/09/22 0722  TempSrc: Oral  PainSc: 0-No pain         Complications: No notable events documented.

## 2022-04-10 ENCOUNTER — Encounter: Payer: Self-pay | Admitting: Surgery

## 2022-04-11 LAB — SURGICAL PATHOLOGY

## 2022-04-11 NOTE — Progress Notes (Signed)
04/12/22 5:41 PM   Nicholas Long 10-02-1991 [id number]  Referring provider:  Janeece Agee, NP [address] [place],  [place] [address]  Chief Complaint  Patient presents with   Other    HPI: Nicholas Long is a 31 y.o.male who presents today for a voiding trial.   He underwent foley removal, cystoscopy, and foley placement over wire on 04/09/2022 with Dr Richardo Hanks for inability to remove foley catheter. Intraoperative findings showed a false passage in the bulbar urethra with some trauma from balloon inflation, prostate normal on cystoscopy.    He has some questions today as he was under the effects of anesthesia when all this occurred.  He states that the catheter has had some gross hematuria and associated with pain and he is happy to have it removed today.  Urine is clear yellow in the bag today.  He did state that when he was 16 and having gallbladder issues he had issues with urination which sounded like postoperative retention.  He did states that after that happened his urinary pressure has not been as strong.  Patient denies any modifying or aggravating factors.  Patient denies any gross hematuria, dysuria or suprapubic/flank pain.  Patient denies any fevers, chills, nausea or vomiting.    PMH: Past Medical History:  Diagnosis Date   Appendicitis    Gallbladder & bile duct stone with obstruction 02/06/2008   Low testosterone    Sleep apnea     Surgical History: Past Surgical History:  Procedure Laterality Date   APPENDECTOMY     CHOLECYSTECTOMY     CYSTOSCOPY  04/09/2022   Procedure: CYSTOSCOPY, REMOVAL OF URINARY CATHETER;  Surgeon: Sondra Come, MD;  Location: [facility] ORS;  Service: Urology;;   GALLBLADDER SURGERY  02/06/2008   LAPAROSCOPIC APPENDECTOMY N/A 04/09/2022   Procedure: APPENDECTOMY LAPAROSCOPIC;  Surgeon: Henrene Dodge, MD;  Location: [facility] OR[facility];  Service: General;  Laterality: N/A;    Home Medications:  Allergies as of 04/12/2022        Reactions   Escitalopram    Made jittery and caused sharp pain in head   Morphine And Related Other (See Comments)   Burning in veins        Medication List        Accurate as of April 12, 2022  5:41 PM. If you have any questions, ask your nurse or doctor.          acetaminophen 500 MG tablet Commonly known as: TYLENOL Take 2 tablets (1,000 mg total) by mouth every 6 (six) hours as needed for mild pain.   aspirin EC 81 MG tablet Take 81 mg by mouth daily. Swallow whole.   aspirin-acetaminophen-caffeine 250-250-65 MG tablet Commonly known as: EXCEDRIN MIGRAINE Take by mouth every 6 (six) hours as needed for headache.   ibuprofen 800 MG tablet Commonly known as: ADVIL Take 1 tablet (800 mg total) by mouth every 8 (eight) hours as needed for moderate pain.   MULTIVITAMIN ADULT PO Take by mouth.   OVER THE COUNTER MEDICATION Excedrin head care   oxyCODONE 5 MG immediate release tablet Commonly known as: Oxy IR/ROXICODONE Take 1 tablet (5 mg total) by mouth every 4 (four) hours as needed for severe pain.   Testosterone 20.25 MG/ACT (1.62%) Gel Commonly known as: AndroGel Pump Place 1 application. onto the skin daily.   VITAMIN B 12 PO Take by mouth.   Vitamin D (Ergocalciferol) 1.25 MG (50000 UNIT) Caps capsule Commonly known as: DRISDOL Take  1 capsule (50,000 Units total) by mouth every 7 (seven) days.   vitamin D3 50 MCG (2000 UT) Caps Take by mouth.        Allergies:  Allergies  Allergen Reactions   Escitalopram     Made jittery and caused sharp pain in head   Morphine And Related Other (See Comments)    Burning in veins    Family History: Family History  Problem Relation Age of Onset   Depression Mother        Living   Diverticulosis Father    Pulmonary embolism Father    Hypertension Father    Bipolar disorder Sister    Cancer Sister        Deceased at Age 54   Bipolar disorder Maternal Grandmother    Hypertension Maternal Grandfather     Stroke Maternal Grandfather    Ovarian cancer Paternal Grandmother     Social History:  reports that he has never smoked. He has never used smokeless tobacco. He reports current alcohol use. He reports that he does not currently use drugs.   Physical Exam: Constitutional:  Alert and oriented, No acute distress. HEENT: [place] AT, moist mucus membranes.  Trachea midline Cardiovascular: No clubbing, cyanosis, or edema. Respiratory: Normal respiratory effort, no increased work of breathing. Neurologic: Grossly intact, no focal deficits, moving all 4 extremities. Psychiatric: Normal mood and affect.  Laboratory Data: Lab Results  Component Value Date   CREATININE 0.95 02/08/2022   Lab Results  Component Value Date   HGBA1C 5.6 01/11/2022   Component     Latest Ref Rng 02/08/2022  WBC     4.0 - 10.5 K/uL 8.0   RBC     4.22 - 5.81 Mil/uL 4.90   Hemoglobin     13.0 - 17.0 g/dL 14.3   HCT     39.0 - 52.0 % 42.5   MCV     78.0 - 100.0 fl 86.8   MCHC     30.0 - 36.0 g/dL 33.5   RDW     11.5 - 15.5 % 13.7   Platelets     150.0 - 400.0 K/uL 232.0   Neutrophils     43.0 - 77.0 % 68.6   Lymphocytes     12.0 - 46.0 % 21.6   Monocytes Relative     3.0 - 12.0 % 7.2   Eosinophil     0.0 - 5.0 % 1.9   Basophil     0.0 - 3.0 % 0.7   NEUT#     1.4 - 7.7 K/uL 5.5   Lymphocyte #     0.7 - 4.0 K/uL 1.7   Monocyte #     0.1 - 1.0 K/uL 0.6   Eosinophils Absolute     0.0 - 0.7 K/uL 0.1   Basophils Absolute     0.0 - 0.1 K/uL 0.1   MCH     26.0 - 34.0 pg   nRBC     0.0 - 0.2 %   Immature Granulocytes     %   Abs Immature Granulocytes     0.00 - 0.07 K/uL    Component     Latest Ref Rng 02/08/2022  TSH     0.35 - 5.50 uIU/mL 0.90    Component     Latest Ref Rng 02/08/2022  Sodium     135 - 145 mEq/L 139   Potassium     3.5 - 5.1 mEq/L 4.2   Chloride  96 - 112 mEq/L 106   CO2     19 - 32 mEq/L 25   Glucose     70 - 99 mg/dL 95   BUN     6 - 23 mg/dL 16    Creatinine     0.35 - 1.50 mg/dL 0.09   Total Bilirubin     0.2 - 1.2 mg/dL 0.5   Alkaline Phosphatase     39 - 117 U/L 66   AST     0 - 37 U/L 33   ALT     0 - 53 U/L 82 (H)   Total Protein     6.0 - 8.3 g/dL 6.7   Albumin     3.5 - 5.2 g/dL 4.1   GFR     >38.18 mL/min 105.83   Calcium     8.4 - 10.5 mg/dL 9.1     Legend: (H) High  Component     Latest Ref Rng 02/08/2022  Vitamin B12     211 - 911 pg/mL 202 (L)   Folate     >5.9 ng/mL 7.5     Legend: (L) Low  Component     Latest Ref Rng 02/08/2022  VITD     30.00 - 100.00 ng/mL 18.08 (L)     Legend: (L) Low  Component     Latest Ref Rng 02/08/2022  Testosterone     300.00 - 890.00 ng/dL 299.37 (L)     Legend: (L) Low I have reviewed the labs.   Catheter Removal Patient is present today for a catheter removal.  9 ml of water was drained from the balloon. A 16 FR council tip foley cath was removed from the bladder no complications were noted . Patient tolerated well.  Pertinent Imaging: N/A  Assessment & Plan:    1. Urethral trauma -Secondary to urethral false passage in the bulbar urethra and balloon inflation in the false passage -Catheter removed today -He is concerned about permanent damage in the future and we discussed this in detail as he may be at risk for stricture disease in the future.  I did state if he could keep his fluid uptake to the point where he has frequent urinations over these next few weeks, this will hopefully help keep the urethral tract open and prevent scar tissue from forming   Return for I PSS and PVR with Dr. Richardo Hanks in 9 months .  [facility] [address] [place], [place] [address] [phone]

## 2022-04-12 ENCOUNTER — Ambulatory Visit: Payer: Self-pay | Admitting: Urology

## 2022-04-12 ENCOUNTER — Encounter: Payer: Self-pay | Admitting: Urology

## 2022-04-12 ENCOUNTER — Ambulatory Visit: Payer: BC Managed Care – PPO | Admitting: Urology

## 2022-04-12 DIAGNOSIS — S3730XD Unspecified injury of urethra, subsequent encounter: Secondary | ICD-10-CM | POA: Diagnosis not present

## 2022-04-12 NOTE — Progress Notes (Incomplete)
04/12/22 5:42 AM   Nicholas Long 01-Nov-1990 [id number]  Referring provider:  Janeece Agee, NP [address] [place],  [place] [address] No chief complaint on file.   HPI: Nicholas Long is a 31 y.o.male who presents today for PM voiding trial.    He underwent foley removal, cystoscopy, and foley placement over wire on 04/09/2022 with Dr Richardo Hanks for inability to remove foley catheter. Intraoperative findings showed a false passage in the bulbar urethra with some trauma from balloon inflation, prostate normal on cystoscopy.         Patient denies any modifying or aggravating factors.  Patient denies any gross hematuria, dysuria or suprapubic/flank pain.  Patient denies any fevers, chills, nausea or vomiting.       PMH: Past Medical History:  Diagnosis Date   Appendicitis    Gallbladder & bile duct stone with obstruction 02/06/2008   Low testosterone    Sleep apnea     Surgical History: Past Surgical History:  Procedure Laterality Date   APPENDECTOMY     CHOLECYSTECTOMY     CYSTOSCOPY  04/09/2022   Procedure: CYSTOSCOPY, REMOVAL OF URINARY CATHETER;  Surgeon: Sondra Come, MD;  Location: [facility];  Service: Urology;;   GALLBLADDER SURGERY  02/06/2008   LAPAROSCOPIC APPENDECTOMY N/A 04/09/2022   Procedure: APPENDECTOMY LAPAROSCOPIC;  Surgeon: Henrene Dodge, MD;  Location: [facility];  Service: General;  Laterality: N/A;    Home Medications:  Allergies as of 04/12/2022       Reactions   Escitalopram    Made jittery and caused sharp pain in head   Morphine And Related Other (See Comments)   Burning in veins        Medication List        Accurate as of April 12, 2022  5:42 AM. If you have any questions, ask your nurse or doctor.          acetaminophen 500 MG tablet Commonly known as: TYLENOL Take 2 tablets (1,000 mg total) by mouth every 6 (six) hours as needed for mild pain.   aspirin EC 81 MG tablet Take 81 mg by mouth daily. Swallow  whole.   aspirin-acetaminophen-caffeine 250-250-65 MG tablet Commonly known as: EXCEDRIN MIGRAINE Take by mouth every 6 (six) hours as needed for headache.   ibuprofen 800 MG tablet Commonly known as: ADVIL Take 1 tablet (800 mg total) by mouth every 8 (eight) hours as needed for moderate pain.   MULTIVITAMIN ADULT PO Take by mouth.   OVER THE COUNTER MEDICATION Excedrin head care   oxyCODONE 5 MG immediate release tablet Commonly known as: Oxy IR/ROXICODONE Take 1 tablet (5 mg total) by mouth every 4 (four) hours as needed for severe pain.   Testosterone 20.25 MG/ACT (1.62%) Gel Commonly known as: AndroGel Pump Place 1 application. onto the skin daily.   VITAMIN B 12 PO Take by mouth.   Vitamin D (Ergocalciferol) 1.25 MG (50000 UNIT) Caps capsule Commonly known as: DRISDOL Take 1 capsule (50,000 Units total) by mouth every 7 (seven) days.   vitamin D3 50 MCG (2000 UT) Caps Take by mouth.        Allergies:  Allergies  Allergen Reactions   Escitalopram     Made jittery and caused sharp pain in head   Morphine And Related Other (See Comments)    Burning in veins    Family History: Family History  Problem Relation Age of Onset   Depression Mother  Living   Diverticulosis Father    Pulmonary embolism Father    Hypertension Father    Bipolar disorder Sister    Cancer Sister        Deceased at Age 68   Bipolar disorder Maternal Grandmother    Hypertension Maternal Grandfather    Stroke Maternal Grandfather    Ovarian cancer Paternal Grandmother     Social History:  reports that he has never smoked. He has never used smokeless tobacco. He reports current alcohol use. He reports that he does not currently use drugs.   Physical Exam: There were no vitals taken for this visit.  Constitutional:  Alert and oriented, No acute distress. HEENT: [place] AT, moist mucus membranes.  Trachea midline, no masses. Cardiovascular: No clubbing, cyanosis, or  edema. Respiratory: Normal respiratory effort, no increased work of breathing. GI: Abdomen is soft, nontender, nondistended, no abdominal masses GU: No CVA tenderness Lymph: No cervical or inguinal lymphadenopathy. Skin: No rashes, bruises or suspicious lesions. Neurologic: Grossly intact, no focal deficits, moving all 4 extremities. Psychiatric: Normal mood and affect.  Laboratory Data:  Lab Results  Component Value Date   CREATININE 0.95 02/08/2022     Lab Results  Component Value Date   HGBA1C 5.6 01/11/2022    Urinalysis   Pertinent Imaging:   Assessment & Plan:     No follow-ups on file.  [facility] [address] [place], [place] [address] [phone]

## 2022-04-15 ENCOUNTER — Other Ambulatory Visit: Payer: Self-pay | Admitting: Surgery

## 2022-04-16 ENCOUNTER — Other Ambulatory Visit: Payer: Self-pay | Admitting: Registered Nurse

## 2022-04-16 ENCOUNTER — Telehealth: Payer: Self-pay | Admitting: Surgery

## 2022-04-16 DIAGNOSIS — E291 Testicular hypofunction: Secondary | ICD-10-CM

## 2022-04-16 MED ORDER — OXYCODONE HCL 5 MG PO TABS: 5.0000 mg | ORAL_TABLET | ORAL | 0 refills | Status: DC | PRN

## 2022-04-16 NOTE — Telephone Encounter (Signed)
PDMP consulted.  Last fill on 04/09/22 for #30 (5 day supply)  Unfortunately had complication during operation of false passage in urethra, leading to inability to remove catheter and trauma. He has ongoing pain due to this  Counseled on safe use of opioid pain medication. Will continue to reduce use.  Follow up as scheduled with surgeon   Jari Sportsman, NP

## 2022-04-16 NOTE — Telephone Encounter (Signed)
Patient's wife calls. Patient had lap appendectomy done on 04/09/22 with Dr. Aleen Campi, he had some complications, they had to put in a catheter.  The cath has since been removed, but he is still having a lot of pain.  Wanting a refill of his Oxycodone. [facility] [place].  Please call, thank you.

## 2022-04-16 NOTE — Telephone Encounter (Addendum)
Noted. PDMP noted.

## 2022-04-16 NOTE — Telephone Encounter (Signed)
Patient is requesting a refill of the following medications: Requested Prescriptions   Pending Prescriptions Disp Refills   oxyCODONE (OXY IR/ROXICODONE) 5 MG immediate release tablet 30 tablet 0    Sig: Take 1 tablet (5 mg total) by mouth every 4 (four) hours as needed for severe pain.    Date of patient request: 04/16/2022 Last office visit: 02/08/2022 Date of last refill: 04/09/2022 Last refill amount: 30 Follow up time period per chart: 05/14/2022

## 2022-04-19 ENCOUNTER — Telehealth: Payer: Self-pay | Admitting: *Deleted

## 2022-04-19 MED ORDER — OXYCODONE HCL 5 MG PO TABS: 5.0000 mg | ORAL_TABLET | ORAL | 0 refills | Status: DC | PRN

## 2022-04-19 NOTE — Telephone Encounter (Signed)
Patient called and wanted to see if he can get another refill on oxycodone- ([facility])   Patient had surgery on 04/09/22 Dr Aleen Campi Appendectomy

## 2022-05-14 ENCOUNTER — Ambulatory Visit (INDEPENDENT_AMBULATORY_CARE_PROVIDER_SITE_OTHER): Payer: BC Managed Care – PPO | Admitting: Family Medicine

## 2022-05-14 ENCOUNTER — Encounter: Payer: BC Managed Care – PPO | Admitting: Registered Nurse

## 2022-05-14 ENCOUNTER — Encounter: Payer: Self-pay | Admitting: Family Medicine

## 2022-05-14 VITALS — BP 132/84 | HR 95 | Temp 98.1°F | Resp 16 | Ht 73.0 in | Wt 394.0 lb

## 2022-05-14 DIAGNOSIS — Z0000 Encounter for general adult medical examination without abnormal findings: Secondary | ICD-10-CM | POA: Diagnosis not present

## 2022-05-14 DIAGNOSIS — E291 Testicular hypofunction: Secondary | ICD-10-CM

## 2022-05-14 LAB — CBC WITH DIFFERENTIAL/PLATELET
Basophils Absolute: 0.1 10*3/uL (ref 0.0–0.1)
Basophils Relative: 0.6 % (ref 0.0–3.0)
Eosinophils Absolute: 0.2 10*3/uL (ref 0.0–0.7)
Eosinophils Relative: 2.9 % (ref 0.0–5.0)
HCT: 44 % (ref 39.0–52.0)
Hemoglobin: 14.6 g/dL (ref 13.0–17.0)
Lymphocytes Relative: 21.2 % (ref 12.0–46.0)
Lymphs Abs: 1.8 10*3/uL (ref 0.7–4.0)
MCHC: 33.2 g/dL (ref 30.0–36.0)
MCV: 87.9 fl (ref 78.0–100.0)
Monocytes Absolute: 0.5 10*3/uL (ref 0.1–1.0)
Monocytes Relative: 6.4 % (ref 3.0–12.0)
Neutro Abs: 5.9 10*3/uL (ref 1.4–7.7)
Neutrophils Relative %: 68.9 % (ref 43.0–77.0)
Platelets: 221 10*3/uL (ref 150.0–400.0)
RBC: 5.01 Mil/uL (ref 4.22–5.81)
RDW: 13.8 % (ref 11.5–15.5)
WBC: 8.5 10*3/uL (ref 4.0–10.5)

## 2022-05-14 LAB — HEMOGLOBIN A1C: Hgb A1c MFr Bld: 5.7 % (ref 4.6–6.5)

## 2022-05-14 LAB — TSH: TSH: 0.91 u[IU]/mL (ref 0.35–5.50)

## 2022-05-14 LAB — TESTOSTERONE: Testosterone: 266.76 ng/dL — ABNORMAL LOW (ref 300.00–890.00)

## 2022-05-14 LAB — VITAMIN D 25 HYDROXY (VIT D DEFICIENCY, FRACTURES): VITD: 22.92 ng/mL — ABNORMAL LOW (ref 30.00–100.00)

## 2022-05-14 NOTE — Assessment & Plan Note (Signed)
Currently on topical testosterone gel.  Check labs and determine if dose needs to be adjusted.

## 2022-05-14 NOTE — Assessment & Plan Note (Signed)
Pt's PE WNL w/ exception of obesity and healing laparoscopic incisions.  UTD on Tdap.  Check labs.  Anticipatory guidance provided.

## 2022-05-14 NOTE — Progress Notes (Signed)
   Subjective:    Patient ID: Nicholas Long, male    DOB: 30-May-1991, 31 y.o.   MRN: [id number]  HPI CPE- UTD on Tdap.  Pt is fasting.  No concerns today.  Health Maintenance  Topic Date Due   INFLUENZA VACCINE  05/08/2022   TETANUS/TDAP  04/29/2024   HIV Screening  Completed   HPV VACCINES  Aged Out   COVID-19 Vaccine  Discontinued   Hepatitis C Screening  Discontinued      Review of Systems Patient reports no vision/hearing changes, anorexia, fever ,adenopathy, persistant/recurrent hoarseness, swallowing issues, chest pain, palpitations, edema, persistant/recurrent cough, hemoptysis, dyspnea (rest,exertional, paroxysmal nocturnal), gastrointestinal  bleeding (melena, rectal bleeding), abdominal pain, excessive heart burn, GU symptoms (dysuria, hematuria, voiding/incontinence issues) syncope, focal weakness, memory loss, numbness & tingling, skin/hair/nail changes, depression, anxiety, abnormal bruising/bleeding, musculoskeletal symptoms/signs.     Objective:   Physical Exam General Appearance:    Alert, cooperative, no distress, appears stated age, obese  Head:    Normocephalic, without obvious abnormality, atraumatic  Eyes:    PERRL, conjunctiva/corneas clear, EOM's intact both eyes       Ears:    Normal TM's and external ear canals, both ears  Nose:   Nares normal, septum midline, mucosa normal, no drainage   or sinus tenderness  Throat:   Lips, mucosa, and tongue normal; teeth and gums normal  Neck:   Supple, symmetrical, trachea midline, no adenopathy;       thyroid:  No enlargement/tenderness/nodules  Back:     Symmetric, no curvature, ROM normal, no CVA tenderness  Lungs:     Clear to auscultation bilaterally, respirations unlabored  Chest wall:    No tenderness or deformity  Heart:    Regular rate and rhythm, S1 and S2 normal, no murmur, rub   or gallop  Abdomen:     Soft, non-tender, bowel sounds active all four quadrants,    no masses, no organomegaly.   Umbilical incision has separated due to constant tension from pannus but has a clean, shallow base.  Incision is not infected and will heal by secondary intention  Genitalia:     Rectal:    Extremities:   Extremities normal, atraumatic, no cyanosis or edema  Pulses:   2+ and symmetric all extremities  Skin:   Skin color, texture, turgor normal, no rashes or lesions  Lymph nodes:   Cervical, supraclavicular, and axillary nodes normal  Neurologic:   CNII-XII intact. Normal strength, sensation and reflexes      throughout          Assessment & Plan:

## 2022-05-14 NOTE — Patient Instructions (Signed)
Follow up in 1 year or as needed with your new provider We'll notify you of your lab results and make any changes if needed Keep incision clean and dry- use peroxide then pat dry and leave open to heal Continue to work on healthy diet and regular exercise- you can do it! Call with any questions or concerns Stay Safe!  Stay Healthy!!!

## 2022-05-14 NOTE — Assessment & Plan Note (Signed)
Ongoing issue for pt.  BMI 51.98.  Encouraged low carb diet and regular exercise.  Check labs to risk stratify.

## 2022-05-15 ENCOUNTER — Other Ambulatory Visit: Payer: Self-pay

## 2022-05-15 LAB — HEPATIC FUNCTION PANEL
ALT: 37 U/L (ref 0–53)
AST: 25 U/L (ref 0–37)
Albumin: 4.1 g/dL (ref 3.5–5.2)
Alkaline Phosphatase: 72 U/L (ref 39–117)
Bilirubin, Direct: 0.1 mg/dL (ref 0.0–0.3)
Total Bilirubin: 0.4 mg/dL (ref 0.2–1.2)
Total Protein: 7.1 g/dL (ref 6.0–8.3)

## 2022-05-15 LAB — LIPID PANEL
Cholesterol: 141 mg/dL (ref 0–200)
HDL: 42.5 mg/dL (ref >39.00)
LDL Cholesterol: 89 mg/dL (ref 0–99)
NonHDL: 98.71
Total CHOL/HDL Ratio: 3
Triglycerides: 50 mg/dL (ref 0.0–149.0)
VLDL: 10 mg/dL (ref 0.0–40.0)

## 2022-05-15 LAB — BASIC METABOLIC PANEL
BUN: 12 mg/dL (ref 6–23)
CO2: 23 mEq/L (ref 19–32)
Calcium: 9.3 mg/dL (ref 8.4–10.5)
Chloride: 107 mEq/L (ref 96–112)
Creatinine, Ser: 0.95 mg/dL (ref 0.40–1.50)
GFR: 105.64 mL/min (ref >60.00)
Glucose, Bld: 99 mg/dL (ref 70–99)
Potassium: 4.3 mEq/L (ref 3.5–5.1)
Sodium: 140 mEq/L (ref 135–145)

## 2022-05-15 MED ORDER — VITAMIN D (ERGOCALCIFEROL) 1.25 MG (50000 UNIT) PO CAPS: 50000.0000 [IU] | ORAL_CAPSULE | ORAL | 12 refills | Status: DC

## 2022-05-15 NOTE — Progress Notes (Signed)
Pt seen results Via my chart  

## 2022-06-30 IMAGING — CT CT ABD-PELV W/ CM
2 of 4 series · 16 of 46 positions shown, 18 images · IV contrast (APPLIED)
Comparison: Previous studies including the examination of
12/28/2021

CLINICAL DATA: Pain right lower quadrant

EXAM:
CT ABDOMEN AND PELVIS WITH CONTRAST
TECHNIQUE: Multidetector CT imaging of the abdomen and pelvis was performed
using the standard protocol following bolus administration of
intravenous contrast.

[Series 2: axial st · axial · 0.94mm/px · z∈[-744,-204]mm · 13 of 120 slices shown, 15 images]
[im 6/120  soft-tissue]
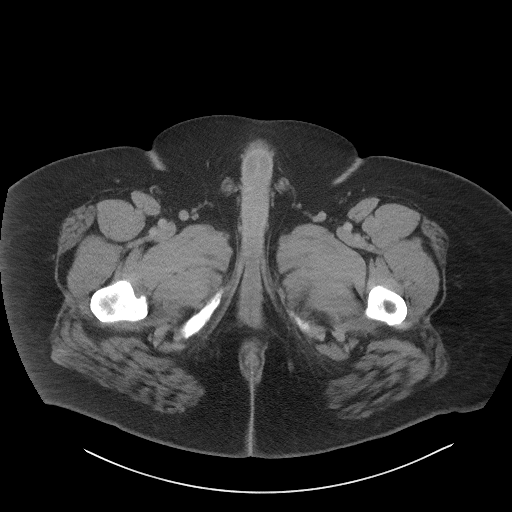
[im 6/120  bone]
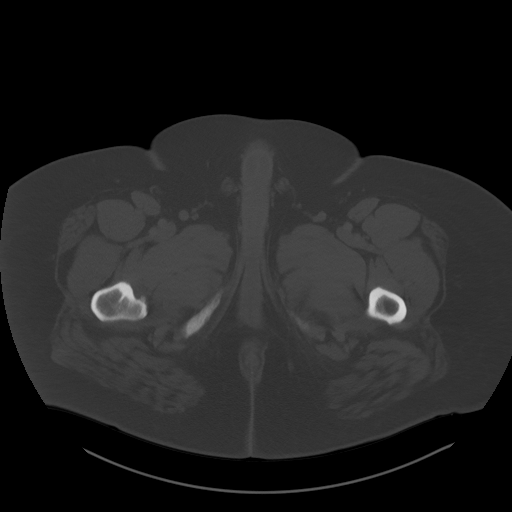
[im 17/120  soft-tissue]
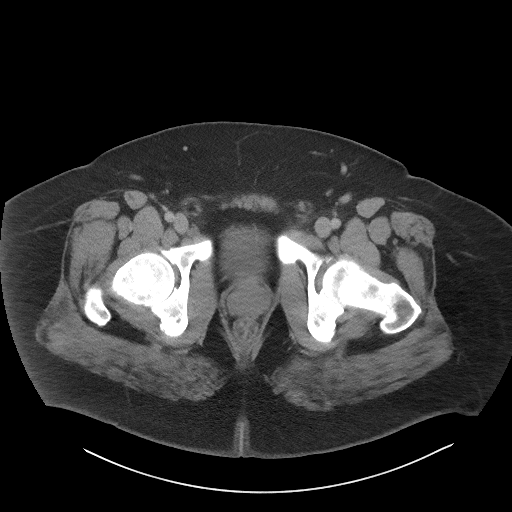
[im 28/120  soft-tissue]
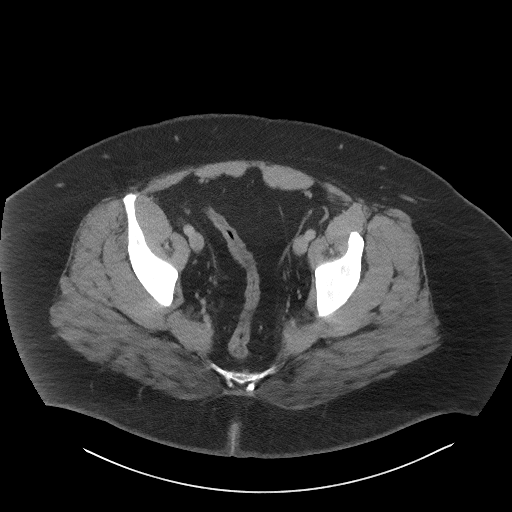
[im 33/120  soft-tissue]
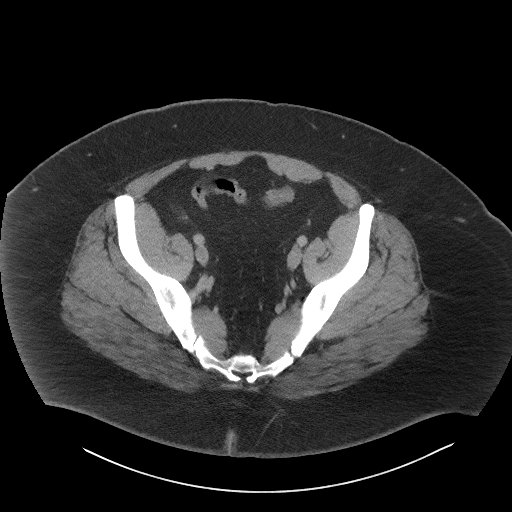
[im 44/120  soft-tissue]
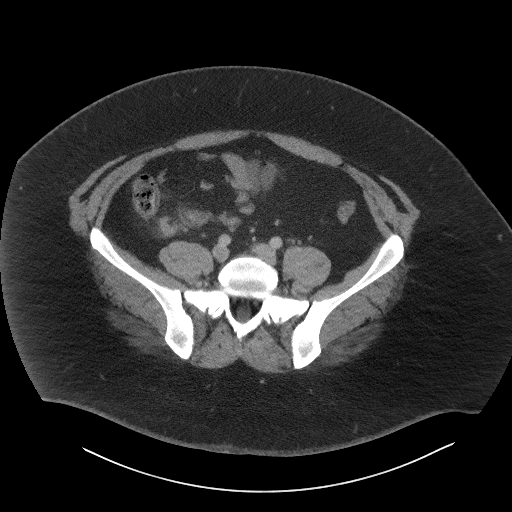
[im 49/120  soft-tissue]
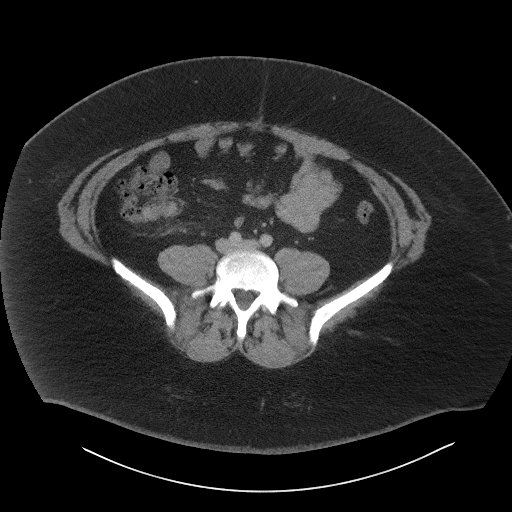
[im 60/120  soft-tissue]
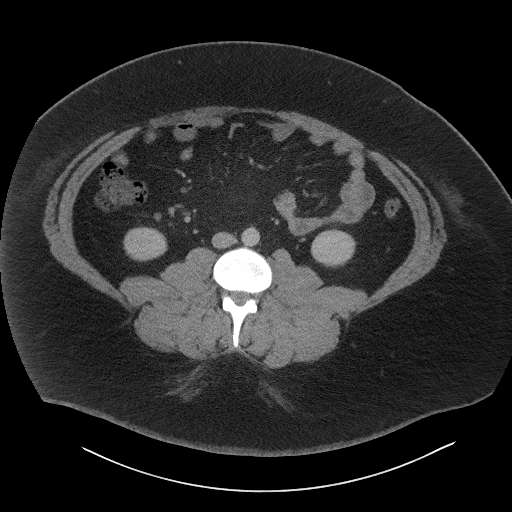
[im 71/120  soft-tissue]
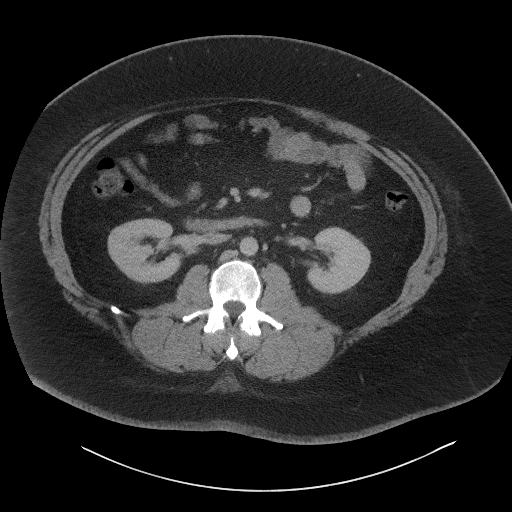
[im 76/120  soft-tissue]
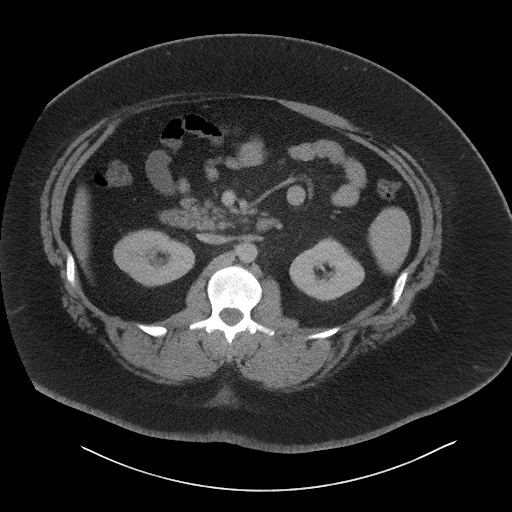
[im 76/120  bone]
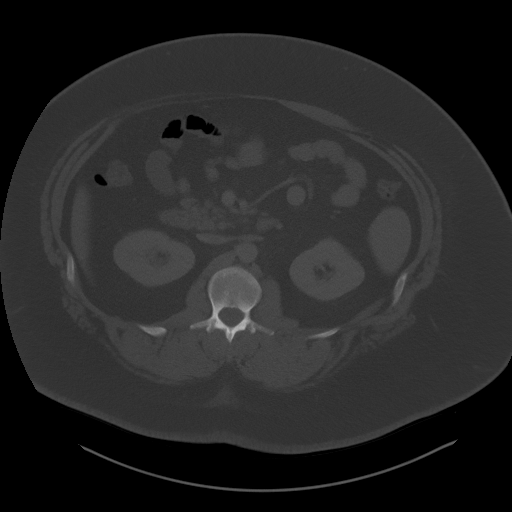
[im 87/120  soft-tissue]
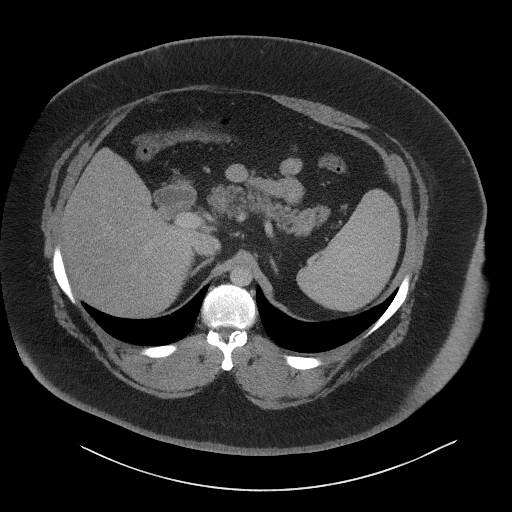
[im 92/120  soft-tissue]
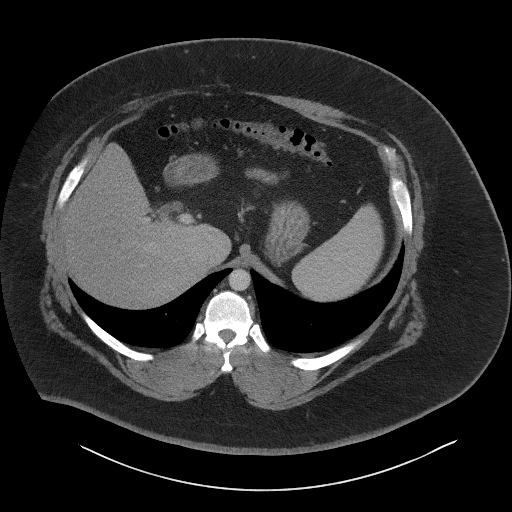
[im 103/120  soft-tissue]
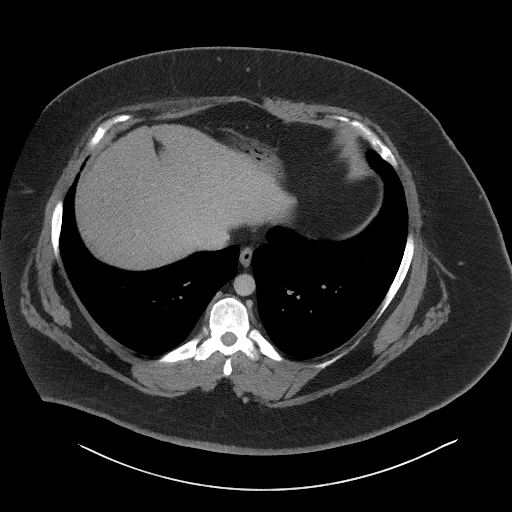
[im 114/120  soft-tissue]
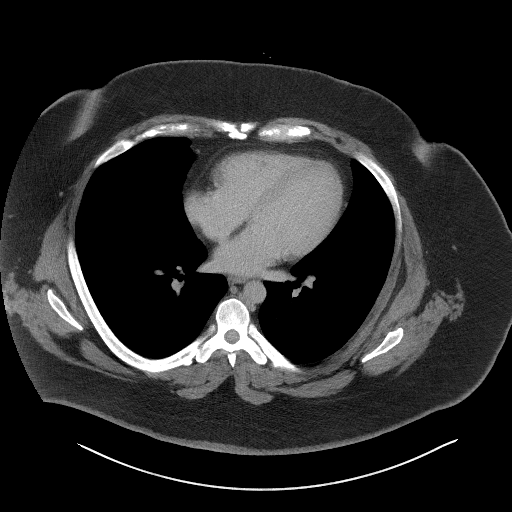

[Series 5: coronal st · coronal · 1.00mm/px · 3 of 134 slices shown]
[im 45/134  soft-tissue]
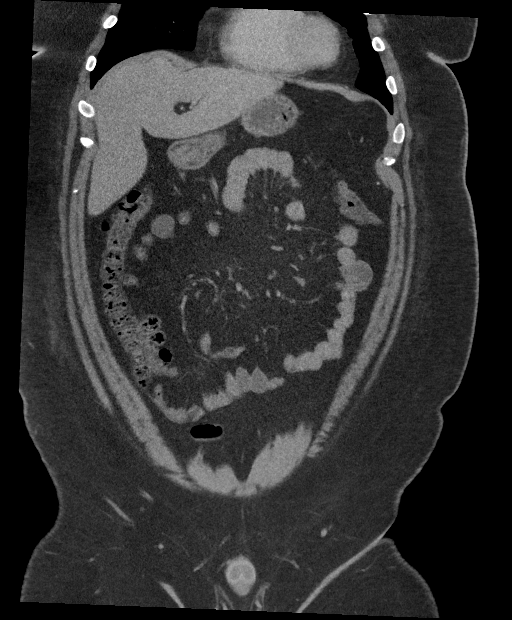
[im 60/134  soft-tissue]
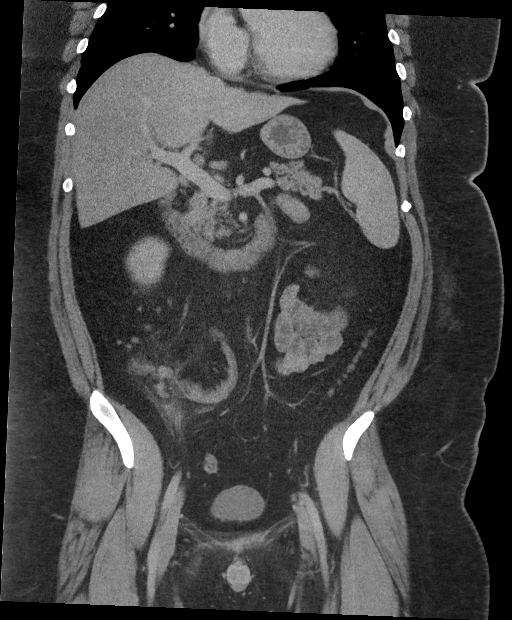
[im 74/134  soft-tissue]
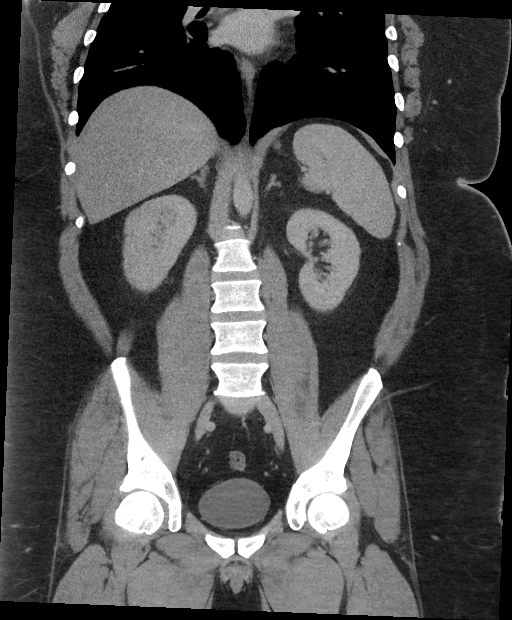

[16 of 46 positions shown; findings below may reference images not displayed]

RADIATION DOSE REDUCTION: This exam was performed according to the
departmental dose-optimization program which includes automated
exposure control, adjustment of the mA and/or kV according to
patient size and/or use of iterative reconstruction technique.

CONTRAST:  100mL OMNIPAQUE IOHEXOL 300 MG/ML  SOLN
FINDINGS: Lower chest: Unremarkable.

Hepatobiliary: There is mild fatty infiltration. No focal
abnormality is seen. Gallbladder is not seen. There is no
significant dilation of bile ducts.

Pancreas: There is fatty infiltration. No focal abnormality is seen.

Spleen: Spleen measures 14.3 cm in maximum diameter.

Adrenals/Urinary Tract: Adrenals are unremarkable. There is no
hydronephrosis. There are no renal or ureteral stones. Urinary
bladder is not distended.

Stomach/Bowel: Stomach is unremarkable. Small bowel loops are not
dilated. There is a tubular structure measuring 10 mm in diameter in
the expected location of the appendix. There is stranding in the
pericecal fat. There is no loculated fluid collection. There is
interval decrease in inflammatory phlegmon in the pericecal region
in comparison with the previous study. There is no significant wall
thickening in colon.

Vascular/Lymphatic: Vascular structures are unremarkable. There are
subcentimeter nodes in the mesentery with no significant interval
change.

Reproductive: Unremarkable.

Other: There is no ascites or pneumoperitoneum. Small umbilical
hernia containing fat is seen.

Musculoskeletal: Unremarkable.
IMPRESSION: There is interval decrease in inflammatory phlegmon in the pericecal
region since 12/28/2021. Appendix appears to be dilated measuring 10
mm in diameter with pericecal stranding. Findings may suggest
residual changes from previous appendicitis or recurrent acute
appendicitis. There is no loculated pericecal fluid collection.

There is no evidence of intestinal obstruction or pneumoperitoneum.
There is no hydronephrosis.

Enlarged spleen.

Other findings as described in the body of the report.

## 2022-07-06 ENCOUNTER — Telehealth: Payer: Self-pay | Admitting: Registered Nurse

## 2022-07-06 DIAGNOSIS — E291 Testicular hypofunction: Secondary | ICD-10-CM

## 2022-07-06 MED ORDER — TESTOSTERONE 20.25 MG/ACT (1.62%) TD GEL: 2.0000 "application " | Freq: Every day | TRANSDERMAL | 1 refills | Status: DC

## 2022-07-06 NOTE — Telephone Encounter (Signed)
Prescription was refilled w/ updated directions to indicate the higher dose

## 2022-07-06 NOTE — Telephone Encounter (Signed)
Left pt a VM stating the Rx was sent in

## 2022-07-06 NOTE — Telephone Encounter (Signed)
Encourage patient to contact the pharmacy for refills or they can request refills through [facility]  (Please schedule appointment if patient has not been seen in over a year)    WHAT PHARMACY WOULD THEY LIKE THIS SENT TO: [id number] - [place], [place] - [address] NAME & DOSE:  Testosterone 20.25 GEL  NOTES/COMMENTS FROM PATIENT: Pt states Dr.Tabori increase his medication from one pump to two pumps a day on each arm. Pt states that he ran of medication due to the increase.      [place] office please notify patient: It takes 48-72 hours to process rx refill requests Ask patient to call pharmacy to ensure rx is ready before heading there.

## 2022-09-10 ENCOUNTER — Other Ambulatory Visit: Payer: Self-pay | Admitting: Family Medicine

## 2022-09-10 DIAGNOSIS — E291 Testicular hypofunction: Secondary | ICD-10-CM

## 2022-10-25 ENCOUNTER — Other Ambulatory Visit: Payer: Self-pay

## 2022-10-25 ENCOUNTER — Other Ambulatory Visit: Payer: Self-pay | Admitting: Family Medicine

## 2022-10-25 DIAGNOSIS — E291 Testicular hypofunction: Secondary | ICD-10-CM

## 2022-10-25 NOTE — Telephone Encounter (Signed)
Testosterone  LOV: 05/14/22 Last Refill:07/06/22 Upcoming appt: no apt

## 2022-10-25 NOTE — Telephone Encounter (Signed)
Left pt a VM stating we denied the Rx and he needs to est care with a new PCP

## 2022-10-25 NOTE — Telephone Encounter (Signed)
Pt is seeing [facility] Feb 20,2024. He is asking if you can refill his Testerone for one month till then

## 2022-10-26 MED ORDER — TESTOSTERONE 20.25 MG/ACT (1.62%) TD GEL: 2.0000 "application " | Freq: Every day | TRANSDERMAL | 0 refills | Status: DC

## 2022-10-26 NOTE — Telephone Encounter (Signed)
Left pt a VM stating the refill has been sent in and to be sure to keep his apt next month w/ Di Kindle

## 2022-11-27 ENCOUNTER — Telehealth: Payer: Self-pay

## 2022-11-27 ENCOUNTER — Encounter: Payer: Self-pay | Admitting: Family Medicine

## 2022-11-27 ENCOUNTER — Telehealth: Payer: Self-pay | Admitting: Registered Nurse

## 2022-11-27 ENCOUNTER — Ambulatory Visit: Payer: No Typology Code available for payment source | Admitting: Family Medicine

## 2022-11-27 ENCOUNTER — Other Ambulatory Visit: Payer: Self-pay

## 2022-11-27 VITALS — BP 122/80 | HR 79 | Temp 97.9°F | Wt 391.0 lb

## 2022-11-27 DIAGNOSIS — E559 Vitamin D deficiency, unspecified: Secondary | ICD-10-CM

## 2022-11-27 DIAGNOSIS — E291 Testicular hypofunction: Secondary | ICD-10-CM

## 2022-11-27 DIAGNOSIS — R7303 Prediabetes: Secondary | ICD-10-CM

## 2022-11-27 DIAGNOSIS — G4733 Obstructive sleep apnea (adult) (pediatric): Secondary | ICD-10-CM

## 2022-11-27 DIAGNOSIS — E538 Deficiency of other specified B group vitamins: Secondary | ICD-10-CM

## 2022-11-27 LAB — CBC WITH DIFFERENTIAL/PLATELET
Basophils Absolute: 0 K/uL (ref 0.0–0.1)
Basophils Relative: 0.5 % (ref 0.0–3.0)
Eosinophils Absolute: 0.2 K/uL (ref 0.0–0.7)
Eosinophils Relative: 2 % (ref 0.0–5.0)
HCT: 45.7 % (ref 39.0–52.0)
Hemoglobin: 15.3 g/dL (ref 13.0–17.0)
Lymphocytes Relative: 26.3 % (ref 12.0–46.0)
Lymphs Abs: 2.2 K/uL (ref 0.7–4.0)
MCHC: 33.6 g/dL (ref 30.0–36.0)
MCV: 87.8 fl (ref 78.0–100.0)
Monocytes Absolute: 0.5 K/uL (ref 0.1–1.0)
Monocytes Relative: 6 % (ref 3.0–12.0)
Neutro Abs: 5.4 K/uL (ref 1.4–7.7)
Neutrophils Relative %: 65.2 % (ref 43.0–77.0)
Platelets: 238 K/uL (ref 150.0–400.0)
RBC: 5.21 Mil/uL (ref 4.22–5.81)
RDW: 13.9 % (ref 11.5–15.5)
WBC: 8.3 K/uL (ref 4.0–10.5)

## 2022-11-27 LAB — VITAMIN B12: Vitamin B-12: 205 pg/mL — ABNORMAL LOW (ref 211–911)

## 2022-11-27 LAB — HEMOGLOBIN A1C: Hgb A1c MFr Bld: 5.6 % (ref 4.6–6.5)

## 2022-11-27 LAB — COMPREHENSIVE METABOLIC PANEL WITH GFR
ALT: 33 U/L (ref 0–53)
AST: 22 U/L (ref 0–37)
Albumin: 4 g/dL (ref 3.5–5.2)
Alkaline Phosphatase: 70 U/L (ref 39–117)
BUN: 11 mg/dL (ref 6–23)
CO2: 28 meq/L (ref 19–32)
Calcium: 9.3 mg/dL (ref 8.4–10.5)
Chloride: 103 meq/L (ref 96–112)
Creatinine, Ser: 0.81 mg/dL (ref 0.40–1.50)
GFR: 115.92 mL/min
Glucose, Bld: 88 mg/dL (ref 70–99)
Potassium: 4.3 meq/L (ref 3.5–5.1)
Sodium: 138 meq/L (ref 135–145)
Total Bilirubin: 0.6 mg/dL (ref 0.2–1.2)
Total Protein: 6.6 g/dL (ref 6.0–8.3)

## 2022-11-27 LAB — TESTOSTERONE: Testosterone: 317.85 ng/dL (ref 300.00–890.00)

## 2022-11-27 LAB — VITAMIN D 25 HYDROXY (VIT D DEFICIENCY, FRACTURES): VITD: 21.36 ng/mL — ABNORMAL LOW (ref 30.00–100.00)

## 2022-11-27 MED ORDER — WEGOVY 0.25 MG/0.5ML ~~LOC~~ SOAJ: 0.2500 mg | SUBCUTANEOUS | 0 refills | Status: DC

## 2022-11-27 MED ORDER — TESTOSTERONE 200 MG IL PLLT: 200.0000 mg | PELLET | Status: DC

## 2022-11-27 NOTE — Telephone Encounter (Signed)
FYI patient called to speak with Tonya. Patient would like Tonya to call back around 4:15

## 2022-11-27 NOTE — Progress Notes (Signed)
New Patient Office Visit  Subjective    Patient ID: Nicholas Long, male    DOB: June 14, 1991  Age: 32 y.o. MRN: [id number]  CC:  Chief Complaint  Patient presents with   Establish Care    Pt states he does not have any concerns.    HPI [name] presents to establish care and chronic management.   Headaches: Chronic. Patient reports he takes Excedrin Migraine as needed for headaches. If his headaches become severe for a short time, he will take [place], as a preventative medication. He usually takes this 2-3 times a month. He reports he has made changes with discontinuing energy drinks and dimming lights to improve his headaches.  He reports his headaches are controlled with OTC medication. He previously seen [facility], Dr. Curt Bears, for a TIA workup vs suspected complicated migraines in April 2023.Marland Kitchen He was instructed to take ASA for 3-6 months, which he has discontinued. He had an MRI brain with/without contrast, echocardiogram complete bubble study, and ultrasound of carotids. All test came back unremarkable according to Dr. Gladstone Lighter note.   Vitamin D and Vitamin B12 Deficiency: Patient is taking vitmain D 50,000 IU, 1 tablet weekly, Vitamin D 2,000 IU, 1 tablet daily, and Vitamin B12 supplement daily for previous deficiency. Last vitamin D was 22.92 on 05/14/2022 and last vitamin B12 was 202 on 02/08/2022.   Hypogonadism: Chronic. Patient is applying Testosterone gel twice daily on each shoulder. He denies any complications with medication. Last testosterone was 266.76 on 05/14/2022.   OSA: Chronic. Patients last sleep apnea test was on 06/03/2018. He is suppose to be using a C-pap, but reports he does not use it because the mask is leaking. He reports he has tried to get parts for the mask, but unsuccessful. He reports he compensates by lying on his abdomin to sleep.   Obesity: Patient is obese and would like to try [facility]. He reports consumes  most of his calories from liquids and usually does not eat a lot of food. He has discontinued energy drinks.  Outpatient Encounter Medications as of 11/27/2022  Medication Sig   aspirin-acetaminophen-caffeine (EXCEDRIN MIGRAINE) 250-250-65 MG tablet Take by mouth every 6 (six) hours as needed for headache.   Cholecalciferol (VITAMIN D3) 50 MCG (2000 UT) CAPS Take by mouth.   Cyanocobalamin (VITAMIN B 12 PO) Take by mouth.   Multiple Vitamin (MULTIVITAMIN ADULT PO) Take by mouth.   Testosterone 20.25 MG/ACT (1.62%) GEL Place 2 application  onto the skin daily. One on each shoulder   Vitamin D, Ergocalciferol, (DRISDOL) 1.25 MG (50000 UNIT) CAPS capsule Take 1 capsule (50,000 Units total) by mouth every 7 (seven) days.   acetaminophen (TYLENOL) 500 MG tablet Take 2 tablets (1,000 mg total) by mouth every 6 (six) hours as needed for mild pain. (Patient not taking: Reported on 11/27/2022)   aspirin EC 81 MG tablet Take 81 mg by mouth daily. Swallow whole. (Patient not taking: Reported on 11/27/2022)   ibuprofen (ADVIL) 800 MG tablet Take 1 tablet (800 mg total) by mouth every 8 (eight) hours as needed for moderate pain. (Patient not taking: Reported on 11/27/2022)   [place] Excedrin head care (Patient not taking: Reported on 11/27/2022)   No facility-administered encounter medications on file as of 11/27/2022.    Past Medical History:  Diagnosis Date   Appendicitis    Gallbladder & bile duct stone with obstruction 02/06/2008   Low testosterone    Sleep apnea  Past Surgical History:  Procedure Laterality Date   APPENDECTOMY     CHOLECYSTECTOMY     CYSTOSCOPY  04/09/2022   Procedure: CYSTOSCOPY, REMOVAL OF URINARY CATHETER;  Surgeon: Billey Co, MD;  Location: [facility] ORS;  Service: Urology;;   GALLBLADDER SURGERY  02/06/2008   LAPAROSCOPIC APPENDECTOMY N/A 04/09/2022   Procedure: APPENDECTOMY LAPAROSCOPIC;  Surgeon: Olean Ree, MD;  Location: [facility] ORS;  Service:  General;  Laterality: N/A;    Family History  Problem Relation Age of Onset   Depression Mother        Living   Diverticulosis Father    Pulmonary embolism Father    Hypertension Father    Bipolar disorder Sister    Cancer Sister        Deceased at Age 40   Bipolar disorder Maternal Grandmother    Hypertension Maternal Grandfather    Stroke Maternal Grandfather    Ovarian cancer Paternal Grandmother     Social History   Socioeconomic History   Marital status: Married    Spouse name: Not on file   Number of children: Not on file   Years of education: Not on file   Highest education level: Not on file  Occupational History   Not on file  Tobacco Use   Smoking status: Never   Smokeless tobacco: Never  Vaping Use   Vaping Use: Never used  Substance and Sexual Activity   Alcohol use: Yes    Comment: rare   Drug use: Not Currently   Sexual activity: Yes    Comment: women -- OCPs  Other Topics Concern   Not on file  Social History Narrative   Right handed    Caffeine- 3-4 cups per day    Social Determinants of Health   Financial Resource Strain: Not on file  Food Insecurity: Not on file  Transportation Needs: Not on file  Physical Activity: Not on file  Stress: Not on file  Social Connections: Not on file  Intimate Partner Violence: Not on file    Review of Systems  Constitutional:  Negative for malaise/fatigue (No more than his usual).  Cardiovascular:  Negative for chest pain.  Neurological:  Positive for headaches.  Psychiatric/Behavioral:  Negative for depression (Denies anxiety).      Objective    BP 122/80   Pulse 79   Temp 97.9 F (36.6 C)   Wt (!) 391 lb (177.4 kg)   SpO2 99%   BMI 51.59 kg/m   Physical Exam Vitals reviewed.  Constitutional:      General: He is not in acute distress.    Appearance: Normal appearance. He is obese. He is not ill-appearing.  HENT:     Head: Normocephalic and atraumatic.  Cardiovascular:     Rate and  Rhythm: Normal rate and regular rhythm.     Heart sounds: Normal heart sounds. No murmur heard.    No friction rub. No gallop.  Pulmonary:     Effort: Pulmonary effort is normal. No respiratory distress.     Breath sounds: Normal breath sounds.  Musculoskeletal:        General: Normal range of motion.  Skin:    General: Skin is warm and dry.  Neurological:     General: No focal deficit present.     Mental Status: He is alert and oriented to person, place, and time. Mental status is at baseline.  Psychiatric:        Mood and Affect: Mood normal.  Behavior: Behavior normal.        Thought Content: Thought content normal.        Judgment: Judgment normal.    Assessment & Plan:  -Ordered influenza vaccine.  -Recheck vitamin D and vitamin B12 due to previous deficiency. Will provide patient further treatment guidance once labs have resulted.  -Follow up in 6 months for a physical exam.   Valarie Merino, NP

## 2022-11-27 NOTE — Assessment & Plan Note (Signed)
Placed a referral for sleep study. Last sleep study was on 06/03/2018. He is suppose to be wearing a c-pap, but reports the mask leaks. Since it has been 4-5 years since last study, needs a new study with new equipment that works effectively for patient.

## 2022-11-27 NOTE — Addendum Note (Signed)
Addended by: Evangeline Gula on: 11/27/2022 06:25 PM   Modules accepted: Orders

## 2022-11-27 NOTE — Assessment & Plan Note (Addendum)
Discussed about weight loss and being obese. He reports he has stopped drinking energy drinks, does not eat a lot of calories. He reports most of his calories come from fluids. He wanted to inquire about Wegovy to take for weight loss. His A1c back in August 2023 was 5.7, barely prediabetic. Will recheck A1c today. If his A1c is elevated, will try to prescribe Ozempic for diabetes management and weight loss. If A1c is normal, will prescribed Wegovy to see if insurance will help with coverage. Discussed about side effects of Wegovy. If he is able to get [facility], he will need to follow up in 1 month to reassess. Encouraged healthy diet and a regular exercise regiment.

## 2022-11-27 NOTE — Telephone Encounter (Signed)
-----   Message from Evangeline Gula, NP sent at 11/27/2022 12:55 PM EST ----- Vitamin D is still low, I would like to confirm that patient is taking Vitamin D3 50,000 IU weekly and vitamin D3 2,000 IU daily? If so recommend to increase Vitamin D3, 2,000 IU daily to 4,000 IU daily.  Vitamin B12 is low recommend Vitamin B12 1,000 mcg injections weekly x 4, then monthly x 2. Recheck vitamin D and vitmain B12 in 3 months.  Testosterone is slowly improving, but still not at goal. Would you like to do testosterone injections every other week or stay on the gel?  A1c is 5.6, not prediabetic and does qualify for Ozempic. If patient would like for me to send in [facility] as discussed at appointment, will be happy to. It would be Wegovy 0.25 mg per injection weekly x 4. You will need a one month follow up if you are able to get [facility].  CBC and CMP are normal.

## 2022-11-27 NOTE — Telephone Encounter (Signed)
Patient agreed to b12 and testosterone injections and agreed to start wegovy. Scheduled him a follow up in one month.   Patient can receive B12 injections from office, testosterone will need to be sent in to pharmacy. Can you please send that in for him or let me know which to send in.   Patient missed one of his 50,000 iu D3 doses, just FYI.  [facility] sent to pharmacy.

## 2022-11-27 NOTE — Assessment & Plan Note (Addendum)
Chronic. Patient is applying Testosterone gel twice daily on each shoulder. Last testosterone was 266.76 back in August 2023. Ordered CBC, PSA, and Testosterone for management of testosterone. He denies any complications with taking medication.

## 2022-11-27 NOTE — Progress Notes (Signed)
[place] about lab results

## 2022-11-27 NOTE — Patient Instructions (Addendum)
-  It was a pleasure to meet you and I look forward to taking care of you.  - Ordered labs and will call with results.  -Discussed about Ozempic vs Wegovy for diabetes vs weight loss. Depending on results, will provide follow up treatment. If Mancel Parsons is started, you will need a one month visit.  -Follow up in 6 months for physical.  -Referral placed for a new sleep study. -Recommend a healthy diet and regular exercise regiment.

## 2022-11-28 ENCOUNTER — Telehealth: Payer: Self-pay

## 2022-11-28 ENCOUNTER — Other Ambulatory Visit (HOSPITAL_COMMUNITY): Payer: Self-pay

## 2022-11-28 LAB — PSA: PSA: 0.44 ng/mL (ref 0.10–4.00)

## 2022-11-28 NOTE — Telephone Encounter (Signed)
Pt was informed.

## 2022-11-28 NOTE — Telephone Encounter (Signed)
Vitamin D is still low, I would like to confirm that patient is taking Vitamin D3 50,000 IU weekly and vitamin D3 2,000 IU daily? If so recommend to increase Vitamin D3, 2,000 IU daily to 4,000 IU daily. Vitamin B12 is low recommend Vitamin B12 1,000 mcg injections weekly x 4, then monthly x 2. Recheck vitamin D and vitmain B12 in 3 months. Testosterone is slowly improving, but still not at goal. Would you like to do testosterone injections every other week or stay on the gel? A1c is 5.6, not prediabetic and does qualify for Ozempic. If patient would like for me to send in [facility] as discussed at appointment, will be happy to. It would be Wegovy 0.25 mg per injection weekly x 4. You will need a one month follow up if you are able to get [facility]. CBC and CMP are normal.

## 2022-11-28 NOTE — Telephone Encounter (Signed)
Submitted PA through [facility]. Key [id number]. Will update with results

## 2022-11-28 NOTE — Telephone Encounter (Signed)
-----   Message from Evangeline Gula, NP sent at 11/28/2022  1:37 PM EST ----- PSA is normal.

## 2022-11-28 NOTE — Telephone Encounter (Signed)
PA is not on CMM please look into this.

## 2022-11-28 NOTE — Progress Notes (Signed)
Informed pt about these results 11/27/2022. RX was sent in. Pt has F/U appt made.

## 2022-11-28 NOTE — Telephone Encounter (Signed)
Spoke with pt about his labs 11/29/2022. Pt has F/U in 1 month with Iva Boop.

## 2022-11-28 NOTE — Telephone Encounter (Signed)
[facility]eg[facility] needs a PA

## 2022-11-28 NOTE — Telephone Encounter (Signed)
Patient called to back to speak with [facility]. The best contact number is  [phone]

## 2022-11-28 NOTE — Telephone Encounter (Signed)
Pharmacy Patient Advocate Encounter   Received notification that prior authorization for [facility] 0.25MG/0.5ML auto-injectors is required/requested.  Per Test Claim: PA required   PA submitted on 11/28/22 to (ins) [employer] via CoverMyMeds [place] Status is pending

## 2022-11-29 ENCOUNTER — Other Ambulatory Visit (HOSPITAL_COMMUNITY): Payer: Self-pay

## 2022-11-29 ENCOUNTER — Telehealth: Payer: Self-pay

## 2022-11-29 MED ORDER — TESTOSTERONE 200 MG IL PLLT: 200.0000 mg | PELLET | Status: DC

## 2022-11-29 NOTE — Telephone Encounter (Signed)
Prior Authorization for [employer] 0.25MG/0.5ML auto-injectors has been approved.    Effective dates: 11/28/22 through 06/29/23

## 2022-11-29 NOTE — Telephone Encounter (Signed)
Pt testosterone injection did not go through to the pharmacy can you please reorder

## 2022-11-29 NOTE — Telephone Encounter (Signed)
Pt informed

## 2022-11-29 NOTE — Telephone Encounter (Signed)
Patient Advocate Encounter  Prior Authorization for [employer] 0.25MG/0.5ML auto-injectors has been approved.    Effective dates: 11/28/22 through 06/29/23

## 2022-11-29 NOTE — Telephone Encounter (Signed)
LM informing pt.

## 2022-11-29 NOTE — Addendum Note (Signed)
Addended by: Evangeline Gula on: 11/29/2022 09:10 AM   Modules accepted: Orders

## 2022-12-04 ENCOUNTER — Encounter: Payer: Self-pay | Admitting: Family Medicine

## 2022-12-04 MED ORDER — TESTOSTERONE CYPIONATE 200 MG/ML IM SOLN: 200.0000 mg | INTRAMUSCULAR | 0 refills | Status: DC

## 2022-12-04 NOTE — Telephone Encounter (Signed)
Looks like the testosterone again didn't go through for some reason.   I see in your lab note he starts weekly injections of B12 and I assume this is whenever he is ready and makes the first appt.

## 2022-12-14 ENCOUNTER — Ambulatory Visit: Payer: No Typology Code available for payment source

## 2022-12-14 DIAGNOSIS — E538 Deficiency of other specified B group vitamins: Secondary | ICD-10-CM | POA: Diagnosis not present

## 2022-12-14 MED ORDER — CYANOCOBALAMIN 1000 MCG/ML IJ SOLN
1000.0000 ug | Freq: Once | INTRAMUSCULAR | Status: AC
  Administered 2022-12-14: 1000 ug via INTRAMUSCULAR

## 2022-12-14 NOTE — Progress Notes (Signed)
Pt came in for his first B12 injection . Gave injection in right deltoid and pt tolerated well. Advised pt to return next week for another injection

## 2022-12-21 ENCOUNTER — Ambulatory Visit (INDEPENDENT_AMBULATORY_CARE_PROVIDER_SITE_OTHER): Payer: No Typology Code available for payment source

## 2022-12-21 DIAGNOSIS — E538 Deficiency of other specified B group vitamins: Secondary | ICD-10-CM

## 2022-12-21 MED ORDER — CYANOCOBALAMIN 1000 MCG/ML IJ SOLN
1000.0000 ug | Freq: Once | INTRAMUSCULAR | Status: AC
  Administered 2022-12-21: 1000 ug via INTRAMUSCULAR

## 2022-12-21 MED ORDER — CYANOCOBALAMIN 1000 MCG/ML IJ SOLN: 1000.0000 ug | Freq: Once | INTRAMUSCULAR | Status: DC

## 2022-12-21 NOTE — Progress Notes (Signed)
Pt came in for his B 12 injection . Gave injection in right deltoid and pt tolerated injection well .

## 2022-12-28 ENCOUNTER — Ambulatory Visit: Payer: No Typology Code available for payment source | Admitting: Family Medicine

## 2022-12-28 ENCOUNTER — Ambulatory Visit (INDEPENDENT_AMBULATORY_CARE_PROVIDER_SITE_OTHER): Payer: No Typology Code available for payment source

## 2022-12-28 DIAGNOSIS — Z09 Encounter for follow-up examination after completed treatment for conditions other than malignant neoplasm: Secondary | ICD-10-CM

## 2022-12-28 DIAGNOSIS — E538 Deficiency of other specified B group vitamins: Secondary | ICD-10-CM | POA: Diagnosis not present

## 2022-12-28 MED ORDER — CYANOCOBALAMIN 1000 MCG/ML IJ SOLN
1000.0000 ug | Freq: Once | INTRAMUSCULAR | Status: AC
  Administered 2022-12-28: 1000 ug via INTRAMUSCULAR

## 2022-12-28 NOTE — Progress Notes (Signed)
Patient presents today for third weekly injection of B12, notes no concerns, has already scheduled next injection.

## 2023-01-03 ENCOUNTER — Ambulatory Visit (INDEPENDENT_AMBULATORY_CARE_PROVIDER_SITE_OTHER): Payer: No Typology Code available for payment source

## 2023-01-03 DIAGNOSIS — E538 Deficiency of other specified B group vitamins: Secondary | ICD-10-CM | POA: Diagnosis not present

## 2023-01-03 MED ORDER — CYANOCOBALAMIN 1000 MCG/ML IJ SOLN
1000.0000 ug | Freq: Once | INTRAMUSCULAR | Status: AC
  Administered 2023-01-03: 1000 ug via INTRAMUSCULAR

## 2023-01-03 NOTE — Progress Notes (Signed)
Pt came in for his last weekly B 12 injection . Now pt will start his monthly injections on 02/04/23. Tolerated injection well

## 2023-01-17 ENCOUNTER — Ambulatory Visit: Payer: BC Managed Care – PPO | Admitting: Urology

## 2023-01-27 ENCOUNTER — Encounter: Payer: Self-pay | Admitting: Family Medicine

## 2023-01-27 DIAGNOSIS — E291 Testicular hypofunction: Secondary | ICD-10-CM

## 2023-01-28 NOTE — Telephone Encounter (Signed)
Pt is asking if after his finally dose of testosterone on 02/10/2023 should he get retested then decide about continuing therapy?

## 2023-02-04 ENCOUNTER — Ambulatory Visit (INDEPENDENT_AMBULATORY_CARE_PROVIDER_SITE_OTHER): Payer: No Typology Code available for payment source

## 2023-02-04 DIAGNOSIS — E538 Deficiency of other specified B group vitamins: Secondary | ICD-10-CM

## 2023-02-04 MED ORDER — CYANOCOBALAMIN 1000 MCG/ML IJ SOLN
1000.0000 ug | Freq: Once | INTRAMUSCULAR | Status: AC
  Administered 2023-02-04: 1000 ug via INTRAMUSCULAR

## 2023-02-04 NOTE — Progress Notes (Unsigned)
Pt came in for his B 12 injection . Gave 1 mL of B12 in right deltoid and pt tolerated well .

## 2023-02-18 ENCOUNTER — Other Ambulatory Visit: Payer: No Typology Code available for payment source

## 2023-02-18 ENCOUNTER — Other Ambulatory Visit: Payer: Self-pay

## 2023-02-18 ENCOUNTER — Telehealth: Payer: Self-pay

## 2023-02-18 DIAGNOSIS — E291 Testicular hypofunction: Secondary | ICD-10-CM | POA: Diagnosis not present

## 2023-02-18 LAB — TESTOSTERONE: Testosterone: 630.29 ng/dL (ref 300.00–890.00)

## 2023-02-18 NOTE — Telephone Encounter (Signed)
-----   Message from Alveria Apley, NP sent at 02/18/2023  2:05 PM EDT ----- Testosterone has improved with taking Testosterone 200mg  every 2 weeks. Recommend to continue as long as he are feeling better. Would need another testosterone lab in 3 months, 1 week after your last injection.  Rx: Testosterone 200mg  per injection every 14 days for 6 doses. Then, needs a testosterone lab and CBC (UE:[id number] in male), 1 week after his last injection.

## 2023-02-25 MED ORDER — TESTOSTERONE CYPIONATE 200 MG/ML IM SOLN: 200.0000 mg | INTRAMUSCULAR | 0 refills | Status: DC

## 2023-03-07 ENCOUNTER — Ambulatory Visit: Payer: No Typology Code available for payment source

## 2023-03-07 DIAGNOSIS — E538 Deficiency of other specified B group vitamins: Secondary | ICD-10-CM

## 2023-03-07 MED ORDER — CYANOCOBALAMIN 1000 MCG/ML IJ SOLN
1000.0000 ug | Freq: Once | INTRAMUSCULAR | Status: AC
Start: 2023-03-07 — End: 2023-03-07
  Administered 2023-03-07: 1000 ug via INTRAMUSCULAR

## 2023-03-07 NOTE — Progress Notes (Signed)
Pt came in for his monthly B 12 injection . Gave 1 mL in of B 12 in right deltoid and pt tolerated well .

## 2023-03-08 ENCOUNTER — Ambulatory Visit: Payer: No Typology Code available for payment source | Admitting: Family Medicine

## 2023-03-08 VITALS — BP 112/78 | HR 78 | Temp 97.9°F | Ht 73.0 in | Wt >= 6400 oz

## 2023-03-08 DIAGNOSIS — R21 Rash and other nonspecific skin eruption: Secondary | ICD-10-CM

## 2023-03-08 DIAGNOSIS — L659 Nonscarring hair loss, unspecified: Secondary | ICD-10-CM | POA: Diagnosis not present

## 2023-03-08 NOTE — Patient Instructions (Signed)
-  Referral to dermatology. If you do not hear about an appointment in 2 weeks call back to the office. -Cleanse the area with antibacterial soap and pat dry.

## 2023-03-08 NOTE — Progress Notes (Signed)
   Acute Office Visit   Subjective:  Patient ID: Nicholas Long, male    DOB: Mar 04, 1991, 32 y.o.   MRN: [id number]  Chief Complaint  Patient presents with   Hair/Scalp Problem    Pt is here today with C/O of bump 5/10 and he popped this. Pt c/o the hair has been falling out since. Pt would like a referral to Derm.    HPI Patient is here for an acute visit for a bump on the back of his head that he noticed on 02/15/2023. He reports it was fluid filled, tender, and slightly moveable. He popped it that day and reports a lot of blood came out. Also, has several scaly areas on the back of his head.   He reports he has started testosterone injections, he has noticed more random bumps on his back and chest. He is requesting a referral to dermatology.   ROS See HPI above     Objective:   BP 112/78   Pulse 78   Temp 97.9 F (36.6 C)   Ht 6\' 1"  (1.854 m)   Wt (!) 406 lb 8 oz (184.4 kg)   SpO2 97%   BMI 53.63 kg/m    Physical Exam Vitals reviewed.  Constitutional:      General: He is not in acute distress.    Appearance: Normal appearance. He is obese. He is not ill-appearing, toxic-appearing or diaphoretic.  HENT:     Head: Normocephalic and atraumatic.  Eyes:     General:        Right eye: No discharge.        Left eye: No discharge.     Conjunctiva/sclera: Conjunctivae normal.  Cardiovascular:     Rate and Rhythm: Normal rate.  Pulmonary:     Effort: Pulmonary effort is normal. No respiratory distress.  Musculoskeletal:        General: Normal range of motion.  Skin:    General: Skin is warm and dry.     Findings: Lesion (Back of head: two slightly raised scaly lesions that are slight erythema. Hairloss around this area. Several other little raised areas on back of head.) present.  Neurological:     General: No focal deficit present.     Mental Status: He is alert and oriented to person, place, and time. Mental status is at baseline.  Psychiatric:        Mood and  Affect: Mood normal.        Behavior: Behavior normal.        Thought Content: Thought content normal.        Judgment: Judgment normal.         Assessment & Plan:  Skin eruption -     Ambulatory referral to Dermatology  Patchy loss of hair -     Ambulatory referral to Dermatology  --Referral to dermatology for the area on the back of his head and hair loss around the area. -Cleanse the area with antibacterial soap and pat dry. It does not look infected and appears to be healing from pictures he presented on his phone during visit. Informed patient to call back if it became worse before he could have an appointment with dermatology.   Zandra Abts, NP

## 2023-03-22 ENCOUNTER — Other Ambulatory Visit: Payer: Self-pay | Admitting: Family Medicine

## 2023-03-22 DIAGNOSIS — E291 Testicular hypofunction: Secondary | ICD-10-CM

## 2023-04-03 ENCOUNTER — Telehealth: Payer: Self-pay | Admitting: Family Medicine

## 2023-04-03 NOTE — Telephone Encounter (Signed)
Spoke with pt and he reports he was only given 3 of the 6 dosage of Testerone rx  He spoke with pharmacy and they said we would have to resend this order. I have explained to him they did not fill the rx as written and insurance will not refill this early . He would have to pay out of pocket . I advised him to call back to pharmacy .

## 2023-04-03 NOTE — Telephone Encounter (Signed)
  testosterone cypionate testosterone cypionate (DEPO-TESTOSTERONE) 200 MG/ML injection

## 2023-04-03 NOTE — Telephone Encounter (Signed)
Caller name: GERRELL TABET  On [place]?: Yes  Call back number: [phone] (home)  Provider they see: Alveria Apley, NP  Reason for call:   Pt states pharmacy only gave him 3 of the 6 doses prescribed

## 2023-04-05 ENCOUNTER — Ambulatory Visit (INDEPENDENT_AMBULATORY_CARE_PROVIDER_SITE_OTHER): Payer: No Typology Code available for payment source

## 2023-04-05 DIAGNOSIS — E538 Deficiency of other specified B group vitamins: Secondary | ICD-10-CM

## 2023-04-05 MED ORDER — CYANOCOBALAMIN 1000 MCG/ML IJ SOLN
1000.0000 ug | Freq: Once | INTRAMUSCULAR | Status: AC
Start: 2023-04-05 — End: 2023-04-05
  Administered 2023-04-05: 1000 ug via INTRAMUSCULAR

## 2023-04-05 NOTE — Progress Notes (Signed)
Pt came in for his monthly B 12 injection . Gave 1 mL of B 12 in right deltoid and pt tolerated well . Next injection is scheduled for 05/03/23. Pt had no complaints or concerns today .

## 2023-04-09 ENCOUNTER — Other Ambulatory Visit: Payer: Self-pay | Admitting: Family Medicine

## 2023-04-09 DIAGNOSIS — E291 Testicular hypofunction: Secondary | ICD-10-CM

## 2023-04-09 MED ORDER — TESTOSTERONE CYPIONATE 200 MG/ML IM SOLN
200.0000 mg | INTRAMUSCULAR | 0 refills | Status: DC
Start: 2023-04-09 — End: 2023-06-28

## 2023-04-09 NOTE — Progress Notes (Signed)
Needs a month refill supply.

## 2023-05-01 ENCOUNTER — Telehealth: Payer: Self-pay

## 2023-05-01 NOTE — Telephone Encounter (Signed)
See telephone note.

## 2023-05-03 ENCOUNTER — Ambulatory Visit (INDEPENDENT_AMBULATORY_CARE_PROVIDER_SITE_OTHER): Payer: No Typology Code available for payment source

## 2023-05-03 DIAGNOSIS — E538 Deficiency of other specified B group vitamins: Secondary | ICD-10-CM | POA: Diagnosis not present

## 2023-05-03 MED ORDER — CYANOCOBALAMIN 1000 MCG/ML IJ SOLN
1000.0000 ug | Freq: Once | INTRAMUSCULAR | Status: AC
Start: 2023-05-03 — End: 2023-05-03
  Administered 2023-05-03: 1000 ug via INTRAMUSCULAR

## 2023-05-03 NOTE — Progress Notes (Addendum)
Patient presented today for B12 injection, also sending message to Mardene Celeste to find out if this patient should be retested as it was indicated he was only to have 2 monthly injections and this does mark his 4th monthly injection, but do not see orders for B12 informed pt I would call him with those directions  Was unable to consult on this prior to injection as provider is our of office and not in touch at this time

## 2023-05-08 ENCOUNTER — Encounter (INDEPENDENT_AMBULATORY_CARE_PROVIDER_SITE_OTHER): Payer: Self-pay

## 2023-05-13 ENCOUNTER — Other Ambulatory Visit (INDEPENDENT_AMBULATORY_CARE_PROVIDER_SITE_OTHER): Payer: No Typology Code available for payment source

## 2023-05-13 DIAGNOSIS — E291 Testicular hypofunction: Secondary | ICD-10-CM

## 2023-05-13 LAB — CBC WITH DIFFERENTIAL/PLATELET
Basophils Absolute: 0 10*3/uL (ref 0.0–0.1)
Basophils Relative: 0.5 % (ref 0.0–3.0)
Eosinophils Absolute: 0.2 10*3/uL (ref 0.0–0.7)
Eosinophils Relative: 2 % (ref 0.0–5.0)
HCT: 48.6 % (ref 39.0–52.0)
Hemoglobin: 15.9 g/dL (ref 13.0–17.0)
Lymphocytes Relative: 26.3 % (ref 12.0–46.0)
Lymphs Abs: 2.3 10*3/uL (ref 0.7–4.0)
MCHC: 32.6 g/dL (ref 30.0–36.0)
MCV: 87.8 fl (ref 78.0–100.0)
Monocytes Absolute: 0.6 10*3/uL (ref 0.1–1.0)
Monocytes Relative: 6.8 % (ref 3.0–12.0)
Neutro Abs: 5.7 10*3/uL (ref 1.4–7.7)
Neutrophils Relative %: 64.4 % (ref 43.0–77.0)
Platelets: 236 10*3/uL (ref 150.0–400.0)
RBC: 5.54 Mil/uL (ref 4.22–5.81)
RDW: 13.7 % (ref 11.5–15.5)
WBC: 8.9 10*3/uL (ref 4.0–10.5)

## 2023-05-13 LAB — TESTOSTERONE: Testosterone: 347.2 ng/dL (ref 300.00–890.00)

## 2023-05-16 ENCOUNTER — Encounter: Payer: No Typology Code available for payment source | Admitting: Family Medicine

## 2023-05-18 ENCOUNTER — Other Ambulatory Visit: Payer: Self-pay | Admitting: Family Medicine

## 2023-05-31 ENCOUNTER — Ambulatory Visit (INDEPENDENT_AMBULATORY_CARE_PROVIDER_SITE_OTHER): Payer: No Typology Code available for payment source | Admitting: Family Medicine

## 2023-05-31 DIAGNOSIS — E538 Deficiency of other specified B group vitamins: Secondary | ICD-10-CM

## 2023-05-31 MED ORDER — CYANOCOBALAMIN 1000 MCG/ML IJ SOLN
1000.0000 ug | Freq: Once | INTRAMUSCULAR | Status: AC
Start: 2023-05-31 — End: 2023-05-31
  Administered 2023-05-31: 1000 ug via INTRAMUSCULAR

## 2023-05-31 NOTE — Progress Notes (Signed)
Pt was given B12 in right deltoid with no complications

## 2023-06-28 ENCOUNTER — Other Ambulatory Visit: Payer: Self-pay | Admitting: Family Medicine

## 2023-06-28 DIAGNOSIS — E291 Testicular hypofunction: Secondary | ICD-10-CM

## 2023-06-28 MED ORDER — TESTOSTERONE CYPIONATE 200 MG/ML IM SOLN: 200.0000 mg | INTRAMUSCULAR | 0 refills | Status: DC

## 2023-07-01 ENCOUNTER — Ambulatory Visit: Payer: No Typology Code available for payment source | Admitting: Family Medicine

## 2023-07-01 DIAGNOSIS — E538 Deficiency of other specified B group vitamins: Secondary | ICD-10-CM

## 2023-07-01 MED ORDER — CYANOCOBALAMIN 1000 MCG/ML IJ SOLN
1000.0000 ug | Freq: Once | INTRAMUSCULAR | Status: AC
Start: 2023-07-01 — End: 2023-07-01
  Administered 2023-07-01: 1000 ug via INTRAMUSCULAR

## 2023-07-01 NOTE — Progress Notes (Cosign Needed Addendum)
Pt was given B12 injection Left Deltoid- No concerns

## 2023-07-21 ENCOUNTER — Other Ambulatory Visit: Payer: Self-pay | Admitting: Family Medicine

## 2023-07-21 DIAGNOSIS — E291 Testicular hypofunction: Secondary | ICD-10-CM

## 2023-07-22 ENCOUNTER — Other Ambulatory Visit: Payer: Self-pay | Admitting: Family Medicine

## 2023-07-22 DIAGNOSIS — E291 Testicular hypofunction: Secondary | ICD-10-CM

## 2023-07-22 MED ORDER — TESTOSTERONE CYPIONATE 200 MG/ML IM SOLN: 200.0000 mg | INTRAMUSCULAR | 0 refills | Status: DC

## 2023-08-26 ENCOUNTER — Other Ambulatory Visit: Payer: Self-pay | Admitting: Family Medicine

## 2023-08-26 DIAGNOSIS — E291 Testicular hypofunction: Secondary | ICD-10-CM

## 2023-08-27 MED ORDER — TESTOSTERONE CYPIONATE 200 MG/ML IM SOLN: 200.0000 mg | INTRAMUSCULAR | 0 refills | Status: DC

## 2023-09-23 ENCOUNTER — Other Ambulatory Visit: Payer: Self-pay | Admitting: Family

## 2023-09-23 DIAGNOSIS — E291 Testicular hypofunction: Secondary | ICD-10-CM

## 2023-09-26 ENCOUNTER — Other Ambulatory Visit: Payer: Self-pay | Admitting: Family

## 2023-09-26 ENCOUNTER — Telehealth: Payer: Self-pay

## 2023-09-26 NOTE — Telephone Encounter (Signed)
Copied from CRM [id number]. Topic: Clinical - Medication Refill >> Sep 26, 2023  1:12 PM Almira Coaster wrote: Most Recent Primary Care Visit:  Provider: Alveria Apley  Department: [facility]  Visit Type: NURSE VISIT  Date: 07/01/2023  Medication: testosterone cypionate (DEPOTESTOSTERONE CYPIONATE) 200 MG/ML injection [[id number]]   Has the patient contacted their pharmacy? Yes, Pharmacy sent the request for the refill (Agent: If no, request that the patient contact the pharmacy for the refill. If patient does not wish to contact the pharmacy document the reason why and proceed with request.) (Agent: If yes, when and what did the pharmacy advise?)  Is this the correct pharmacy for this prescription? Yes If no, delete pharmacy and type the correct one.  This is the patient's preferred pharmacy:  [facility] [phone], [place] - [address] [place] [address] Phone: [phone] Fax: [phone]   Has the prescription been filled recently? No  Is the patient out of the medication? Yes  Has the patient been seen for an appointment in the last year OR does the patient have an upcoming appointment? Yes  Can we respond through MyChart? Yes  Agent: Please be advised that Rx refills may take up to 3 business days. We ask that you follow-up with your pharmacy.

## 2023-09-30 ENCOUNTER — Other Ambulatory Visit: Payer: Self-pay | Admitting: Family Medicine

## 2023-09-30 DIAGNOSIS — E291 Testicular hypofunction: Secondary | ICD-10-CM

## 2023-09-30 MED ORDER — TESTOSTERONE CYPIONATE 200 MG/ML IM SOLN
200.0000 mg | INTRAMUSCULAR | 0 refills | Status: DC
Start: 2023-09-30 — End: 2023-10-28

## 2023-09-30 NOTE — Telephone Encounter (Signed)
Left detailed message on machine for patient to schedule an office visit.

## 2023-09-30 NOTE — Telephone Encounter (Signed)
Copied from CRM [id number]. Topic: Clinical - Medication Refill >> Sep 30, 2023  8:56 AM Marica Otter wrote: Most Recent Primary Care Visit:  Provider: Alveria Apley  Department: [facility]  Visit Type: NURSE VISIT  Date: 07/01/2023  Medication: ***  Has the patient contacted their pharmacy?  (Agent: If no, request that the patient contact the pharmacy for the refill. If patient does not wish to contact the pharmacy document the reason why and proceed with request.) (Agent: If yes, when and what did the pharmacy advise?)  Is this the correct pharmacy for this prescription?  If no, delete pharmacy and type the correct one.  This is the patient's preferred pharmacy:  [phone], [place] - [address] [place] [address] Phone: [phone] Fax: [phone]   Has the prescription been filled recently?   Is the patient out of the medication?   Has the patient been seen for an appointment in the last year OR does the patient have an upcoming appointment?   Can we respond through MyChart?   Agent: Please be advised that Rx refills may take up to 3 business days. We ask that you follow-up with your pharmacy.

## 2023-09-30 NOTE — Telephone Encounter (Signed)
Patient scheduled an appointment 10/04/23.

## 2023-10-04 ENCOUNTER — Ambulatory Visit: Payer: No Typology Code available for payment source | Admitting: Internal Medicine

## 2023-10-28 ENCOUNTER — Ambulatory Visit (INDEPENDENT_AMBULATORY_CARE_PROVIDER_SITE_OTHER): Payer: No Typology Code available for payment source | Admitting: General Practice

## 2023-10-28 ENCOUNTER — Other Ambulatory Visit: Payer: Self-pay | Admitting: General Practice

## 2023-10-28 ENCOUNTER — Encounter: Payer: Self-pay | Admitting: General Practice

## 2023-10-28 VITALS — BP 112/78 | HR 76 | Temp 97.3°F | Ht 74.0 in | Wt >= 6400 oz

## 2023-10-28 DIAGNOSIS — Z7689 Persons encountering health services in other specified circumstances: Secondary | ICD-10-CM | POA: Insufficient documentation

## 2023-10-28 DIAGNOSIS — G4733 Obstructive sleep apnea (adult) (pediatric): Secondary | ICD-10-CM

## 2023-10-28 DIAGNOSIS — E559 Vitamin D deficiency, unspecified: Secondary | ICD-10-CM | POA: Diagnosis not present

## 2023-10-28 DIAGNOSIS — E291 Testicular hypofunction: Secondary | ICD-10-CM | POA: Diagnosis not present

## 2023-10-28 DIAGNOSIS — E538 Deficiency of other specified B group vitamins: Secondary | ICD-10-CM | POA: Diagnosis not present

## 2023-10-28 LAB — VITAMIN B12: Vitamin B-12: 368 pg/mL (ref 211–911)

## 2023-10-28 LAB — TESTOSTERONE: Testosterone: 340.01 ng/dL (ref 300.00–890.00)

## 2023-10-28 LAB — CBC
HCT: 50 % (ref 39.0–52.0)
Hemoglobin: 16.4 g/dL (ref 13.0–17.0)
MCHC: 32.9 g/dL (ref 30.0–36.0)
MCV: 88.4 fL (ref 78.0–100.0)
Platelets: 217 10*3/uL (ref 150.0–400.0)
RBC: 5.65 Mil/uL (ref 4.22–5.81)
RDW: 13.8 % (ref 11.5–15.5)
WBC: 7.4 10*3/uL (ref 4.0–10.5)

## 2023-10-28 LAB — VITAMIN D 25 HYDROXY (VIT D DEFICIENCY, FRACTURES): VITD: 21.54 ng/mL — ABNORMAL LOW (ref 30.00–100.00)

## 2023-10-28 MED ORDER — VITAMIN D (ERGOCALCIFEROL) 1.25 MG (50000 UNIT) PO CAPS: 50000.0000 [IU] | ORAL_CAPSULE | ORAL | 0 refills | Status: AC

## 2023-10-28 MED ORDER — TESTOSTERONE CYPIONATE 200 MG/ML IM SOLN: 200.0000 mg | INTRAMUSCULAR | 0 refills | Status: DC

## 2023-10-28 NOTE — Assessment & Plan Note (Signed)
Repeat vitamin d level pending.

## 2023-10-28 NOTE — Assessment & Plan Note (Signed)
EMR reviewed briefly.

## 2023-10-28 NOTE — Assessment & Plan Note (Signed)
Last sleep study was 06/03/18.   Does not wear c-pap due to mask leakage.   Discussed at length with patient regarding compliance. He will think about it and will consider having a repeat sleep study and possibly new equipment.

## 2023-10-28 NOTE — Assessment & Plan Note (Signed)
Repeat b 12 definitely level pending.

## 2023-10-28 NOTE — Assessment & Plan Note (Signed)
Discussed briefly today.   Would consider medication if covered by medication.   Discussed diet and exercise.

## 2023-10-28 NOTE — Progress Notes (Signed)
New Patient Office Visit  Subjective    Patient ID: Nicholas Long, male    DOB: [birth year]  Age: 33 y.o. MRN: [id number]  CC:  Chief Complaint  Patient presents with   Medical Management of Chronic Issues    HPI Nicholas Long is a 33 y.o. male presents to establish care.  Previous PCP/physical/labs:  Nicholas Abts, NP. 2023. Labs done in February 2024.   OSA: diagnosed several years ago. Has a CPAP at home but does not use it. Feels that symptoms are well managed.   Hypogonadism: Chronic. Has tried testosterone gel and was switched to injections since April 2023. He gets refills from the primary care but was told he needed a PCP. He is currently managed on Testosterone 200 mg every 2 weeks. There have been times where he is doing the injection every 3 weeks due pharmacy or refills. Overall the medication is helping him. He does report feeling better and has more energy. He would like to see if he can do these injections once weekly maybe on a lower dose.   TIA - in 01/2022. He went to the ER with right arm tingling/right cheek tingling/speech disturbance. Neuro exam was normal in the ER. He was referred to neurology. He had a MRI brain completed and it was negative. He was started on Asprin 81 mg for 3-6 months. His previous PCP discontinued the ASA as patient's has not had any symptoms.    Outpatient Encounter Medications as of 10/28/2023  Medication Sig   aspirin-acetaminophen-caffeine (EXCEDRIN MIGRAINE) 250-250-65 MG tablet Take by mouth every 6 (six) hours as needed for headache.   Cholecalciferol (VITAMIN D3) 50 MCG (2000 UT) CAPS Take by mouth.   Multiple Vitamin (MULTIVITAMIN ADULT PO) Take by mouth.   Vitamin D, Ergocalciferol, (DRISDOL) 1.25 MG (50000 UNIT) CAPS capsule Take 50,000 Units by mouth every 7 (seven) days.   [DISCONTINUED] testosterone cypionate (DEPOTESTOSTERONE CYPIONATE) 200 MG/ML injection Inject 1 mL (200 mg total) into the muscle every 14  (fourteen) days for 2 doses.   testosterone cypionate (DEPOTESTOSTERONE CYPIONATE) 200 MG/ML injection Inject 1 mL (200 mg total) into the muscle every 14 (fourteen) days for 2 doses.   [DISCONTINUED] Vitamin D, Ergocalciferol, (DRISDOL) 1.25 MG (50000 UNIT) CAPS capsule TAKE 1 CAPSULE (50,000 UNITS TOTAL) BY MOUTH EVERY 7 (SEVEN) DAYS   No facility-administered encounter medications on file as of 10/28/2023.    Past Medical History:  Diagnosis Date   Appendicitis 2023   Eustachian tube dysfunction 05/20/2014   Gallbladder & bile duct stone with obstruction 02/06/2008   Gynecomastia 05/20/2014   History of transient ischemic attack (TIA) 2023   Low testosterone    Sleep apnea    Does not wear C-pap due to mask leaking    Past Surgical History:  Procedure Laterality Date   APPENDECTOMY     CHOLECYSTECTOMY     CYSTOSCOPY  04/09/2022   Procedure: CYSTOSCOPY, REMOVAL OF URINARY CATHETER;  Surgeon: Sondra Come, MD;  Location: [facility] ORS;  Service: Urology;;   GALLBLADDER SURGERY  02/06/2008   LAPAROSCOPIC APPENDECTOMY N/A 04/09/2022   Procedure: APPENDECTOMY LAPAROSCOPIC;  Surgeon: Henrene Dodge, MD;  Location: [facility] ORS;  Service: General;  Laterality: N/A;    Family History  Problem Relation Age of Onset   Depression Mother        Living   Diverticulosis Father    Pulmonary embolism Father    Hypertension Father    Bipolar disorder Sister    Cancer  Sister        Deceased at Age 13   Bipolar disorder Maternal Grandmother    Hypertension Maternal Grandfather    Stroke Maternal Grandfather    Ovarian cancer Paternal Grandmother     Social History   Socioeconomic History   Marital status: Divorced    Spouse name: Not on file   Number of children: 0   Years of education: Not on file   Highest education level: Bachelor's degree (e.g., BA, AB, BS)  Occupational History   Occupation: Transport planner  Tobacco Use   Smoking status: Never   Smokeless tobacco: Never  Vaping Use    Vaping status: Never Used  Substance and Sexual Activity   Alcohol use: Yes    Alcohol/week: 1.0 standard drink of alcohol    Types: 1 Cans of beer per week    Comment: 2 times a month   Drug use: Not Currently   Sexual activity: Yes    Comment: women -- OCPs  Other Topics Concern   Not on file  Social History Narrative   Currently separated, living by himself. He has a dog. No children.    Right handed    Caffeine- 20 oz a day.    Three things he likes watching soccer, working, and hiking.    Social Drivers of Health   Financial Resource Strain: Medium Risk (10/28/2023)   Overall Financial Resource Strain (CARDIA)    Difficulty of Paying Living Expenses: Somewhat hard  Food Insecurity: No Food Insecurity (10/28/2023)   Hunger Vital Sign    Worried About Running Out of Food in the Last Year: Never true    Ran Out of Food in the Last Year: Never true  Transportation Needs: No Transportation Needs (10/28/2023)   PRAPARE - Administrator, Civil Service (Medical): No    Lack of Transportation (Non-Medical): No  Physical Activity: Unknown (10/28/2023)   Exercise Vital Sign    Days of Exercise per Week: 0 days    Minutes of Exercise per Session: Not on file  Stress: Stress Concern Present (10/28/2023)   [employer] of Occupational Health - Occupational Stress Questionnaire    Feeling of Stress : To some extent  Social Connections: Unknown (10/28/2023)   Social Connection and Isolation Panel [NHANES]    Frequency of Communication with Friends and Family: Three times a week    Frequency of Social Gatherings with Friends and Family: Three times a week    Attends Religious Services: Patient declined    Active Member of Clubs or Organizations: No    Attends Engineer, structural: Not on file    Marital Status: Divorced  Intimate Partner Violence: Not on file    Review of Systems  Constitutional:  Negative for chills and fever.  Respiratory:  Negative for  shortness of breath.   Cardiovascular:  Negative for chest pain.  Gastrointestinal:  Negative for abdominal pain, constipation, diarrhea, heartburn, nausea and vomiting.  Genitourinary:  Negative for dysuria, frequency and urgency.  Neurological:  Negative for dizziness and headaches.  Endo/Heme/Allergies:  Negative for polydipsia.  Psychiatric/Behavioral:  Negative for depression and suicidal ideas. The patient is not nervous/anxious.         Objective    BP 112/78   Pulse 76   Temp (!) 97.3 F (36.3 C) (Temporal)   Ht 6\' 2"  (1.88 m)   Wt (!) 406 lb (184.2 kg)   SpO2 96%   BMI 52.13 kg/m   Physical Exam  Vitals and nursing note reviewed.  Constitutional:      Appearance: Normal appearance.  Cardiovascular:     Rate and Rhythm: Normal rate and regular rhythm.     Pulses: Normal pulses.     Heart sounds: Normal heart sounds.  Pulmonary:     Effort: Pulmonary effort is normal.     Breath sounds: Normal breath sounds.  Neurological:     Mental Status: He is alert and oriented to person, place, and time.  Psychiatric:        Mood and Affect: Mood normal.        Behavior: Behavior normal.        Thought Content: Thought content normal.        Judgment: Judgment normal.         Assessment & Plan:  Vitamin D deficiency Assessment & Plan: Repeat vitamin d level pending.  Orders: -     VITAMIN D 25 Hydroxy (Vit-D Deficiency, Fractures)  Hypogonadism in male Assessment & Plan: Chronic.   Continue Testosterone injections.   Rx sent.   Repeat testosterone level pending, cbc pending.   Orders: -     Testosterone -     CBC -     Testosterone Cypionate; Inject 1 mL (200 mg total) into the muscle every 14 (fourteen) days for 2 doses.  Dispense: 2 mL; Refill: 0  B12 deficiency Assessment & Plan: Repeat b 12 definitely level pending.     Orders: -     Vitamin B12  Morbid obesity (HCC) Assessment & Plan: Discussed briefly today.   Would consider  medication if covered by medication.   Discussed diet and exercise.   OSA (obstructive sleep apnea) Assessment & Plan: Last sleep study was 06/03/18.   Does not wear c-pap due to mask leakage.   Discussed at length with patient regarding compliance. He will think about it and will consider having a repeat sleep study and possibly new equipment.   Transfer requested for continuity of care Assessment & Plan: EMR reviewed briefly.      Return in about 1 month (around 11/28/2023) for physical. .   Modesto Charon, NP

## 2023-10-28 NOTE — Assessment & Plan Note (Signed)
Chronic.   Continue Testosterone injections.   Rx sent.   Repeat testosterone level pending, cbc pending.

## 2023-10-28 NOTE — Patient Instructions (Signed)
Stop by the lab prior to leaving today. I will notify you of your results once received.   Please call the company regarding the CPAP.   Schedule physical for February.   It was a pleasure meeting you!

## 2023-11-19 ENCOUNTER — Other Ambulatory Visit: Payer: Self-pay | Admitting: General Practice

## 2023-11-19 DIAGNOSIS — E291 Testicular hypofunction: Secondary | ICD-10-CM

## 2023-11-19 MED ORDER — TESTOSTERONE CYPIONATE 200 MG/ML IM SOLN: 200.0000 mg | INTRAMUSCULAR | 0 refills | Status: DC

## 2023-12-02 ENCOUNTER — Ambulatory Visit (INDEPENDENT_AMBULATORY_CARE_PROVIDER_SITE_OTHER): Payer: No Typology Code available for payment source | Admitting: General Practice

## 2023-12-02 ENCOUNTER — Other Ambulatory Visit: Payer: Self-pay | Admitting: General Practice

## 2023-12-02 ENCOUNTER — Encounter: Payer: Self-pay | Admitting: General Practice

## 2023-12-02 VITALS — BP 122/82 | HR 99 | Temp 98.0°F | Ht 74.0 in | Wt >= 6400 oz

## 2023-12-02 DIAGNOSIS — E538 Deficiency of other specified B group vitamins: Secondary | ICD-10-CM

## 2023-12-02 DIAGNOSIS — E291 Testicular hypofunction: Secondary | ICD-10-CM | POA: Diagnosis not present

## 2023-12-02 DIAGNOSIS — Z79890 Hormone replacement therapy (postmenopausal): Secondary | ICD-10-CM | POA: Diagnosis not present

## 2023-12-02 DIAGNOSIS — Z0000 Encounter for general adult medical examination without abnormal findings: Secondary | ICD-10-CM

## 2023-12-02 DIAGNOSIS — E559 Vitamin D deficiency, unspecified: Secondary | ICD-10-CM

## 2023-12-02 DIAGNOSIS — Z8673 Personal history of transient ischemic attack (TIA), and cerebral infarction without residual deficits: Secondary | ICD-10-CM | POA: Insufficient documentation

## 2023-12-02 NOTE — Assessment & Plan Note (Signed)
 Chronic.  PSA level pending.   Continue Testosterone injections. Will request refill when needed.  Discussed at length regarding the risks for prostate cancer and the importance of prostate exams.   Referral placed for urology.

## 2023-12-02 NOTE — Progress Notes (Signed)
 Established Patient Office Visit  Subjective   Patient ID: Nicholas Long, male    DOB: Jun 24, 1991  Age: 33 y.o. MRN: [id number]  Chief Complaint  Patient presents with   Annual Exam    HPI  Osmani Kersten is a 33 year old male with past medical history of OSA, Morbid obesity, vitamin D deficiency for complete physical.  Immunizations: -Tetanus: Completed in 2015. Due in July.  -Influenza: declines  Diet: Fair diet. Does eat fast foods. Avoids fried foods. Drinks diet soda 4-5 times a week. Drinks water.  Exercise: No regular exercise. Has been trying to walk more.   Eye exam: Completes annually.  Dental exam: Completes semi-annually    Numbness and tingling: He is still having some tingling in the left third digit. Worse at night. He also has some tingling on his nose. All of theses symptoms are intermittent. He was previously seen by [facility] in 2023. He does have a vitamin b 12 deficiency, which was within normal range on 10/28/23. He had a MRI done on 01/25/22 which showed no acute intracranial pathology and small bilateral mastoid effusions.   Patient Active Problem List   Diagnosis Date Noted   Hx of transient ischemic attack (TIA) 12/02/2023   Vitamin D deficiency 10/28/2023   B12 deficiency 10/28/2023   Physical exam 05/14/2022   OSA (obstructive sleep apnea) 06/16/2018   Hypogonadism in male 05/20/2014   Morbid obesity (HCC) 05/20/2014   Past Medical History:  Diagnosis Date   Appendicitis 2023   Eustachian tube dysfunction 05/20/2014   Gallbladder & bile duct stone with obstruction 02/06/2008   Gynecomastia 05/20/2014   History of transient ischemic attack (TIA) 2023   Low testosterone    Sleep apnea    Does not wear C-pap due to mask leaking   Allergies  Allergen Reactions   Escitalopram     Made jittery and caused sharp pain in head   Morphine And Codeine Other (See Comments)    Burning in veins         12/02/2023     3:11 PM 10/28/2023   12:51 PM 11/27/2022    9:34 AM  Depression screen PHQ 2/9  Decreased Interest 0 0 0  Down, Depressed, Hopeless 0 0 0  PHQ - 2 Score 0 0 0  Altered sleeping 0 1 0  Tired, decreased energy 0 1 1  Change in appetite 0 0 1  Feeling bad or failure about yourself  0 0 0  Trouble concentrating 0 1 1  Moving slowly or fidgety/restless 0 0 0  Suicidal thoughts 0 0 0  PHQ-9 Score 0 3 3  Difficult doing work/chores Not difficult at all Somewhat difficult Somewhat difficult       10/28/2023   12:51 PM  GAD 7 : Generalized Anxiety Score  Nervous, Anxious, on Edge 1  Control/stop worrying 1  Worry too much - different things 0  Trouble relaxing 1  Restless 0  Easily annoyed or irritable 0  Afraid - awful might happen 1  Total GAD 7 Score 4  Anxiety Difficulty Somewhat difficult      Review of Systems  Constitutional:  Negative for chills, fever, malaise/fatigue and weight loss.  HENT:  Negative for congestion, ear discharge, ear pain, hearing loss, nosebleeds, sinus pain, sore throat and tinnitus.   Eyes:  Negative for blurred vision, double vision, pain, discharge and redness.  Respiratory:  Negative for cough, shortness of breath, wheezing and stridor.  Cardiovascular:  Negative for chest pain, palpitations and leg swelling.  Gastrointestinal:  Negative for abdominal pain, constipation, diarrhea, heartburn, nausea and vomiting.  Genitourinary:  Negative for dysuria, frequency and urgency.  Musculoskeletal:  Negative for myalgias.  Skin:  Negative for rash.  Neurological:  Positive for tingling. Negative for dizziness, seizures, weakness and headaches.  Endo/Heme/Allergies:  Negative for polydipsia.  Psychiatric/Behavioral:  Negative for depression, substance abuse and suicidal ideas. The patient is not nervous/anxious.       Objective:     BP 122/82 (BP Location: Left Arm, Patient Position: Sitting, Cuff Size: Large)   Pulse 99   Temp 98 F (36.7 C)  (Oral)   Ht 6\' 2"  (1.88 m)   Wt (!) 406 lb (184.2 kg)   SpO2 95%   BMI 52.13 kg/m  BP Readings from Last 3 Encounters:  12/02/23 122/82  10/28/23 112/78  03/08/23 112/78   Wt Readings from Last 3 Encounters:  12/02/23 (!) 406 lb (184.2 kg)  10/28/23 (!) 406 lb (184.2 kg)  03/08/23 (!) 406 lb 8 oz (184.4 kg)      Physical Exam Vitals and nursing note reviewed.  Constitutional:      Appearance: Normal appearance.  HENT:     Head: Normocephalic and atraumatic.     Right Ear: Tympanic membrane, ear canal and external ear normal.     Left Ear: Tympanic membrane, ear canal and external ear normal.     Nose: Nose normal.     Mouth/Throat:     Mouth: Mucous membranes are moist.     Pharynx: Oropharynx is clear.  Eyes:     Conjunctiva/sclera: Conjunctivae normal.     Pupils: Pupils are equal, round, and reactive to light.  Cardiovascular:     Rate and Rhythm: Normal rate and regular rhythm.     Pulses: Normal pulses.     Heart sounds: Normal heart sounds.  Pulmonary:     Effort: Pulmonary effort is normal.     Breath sounds: Normal breath sounds.  Abdominal:     General: Abdomen is flat. Bowel sounds are normal.     Palpations: Abdomen is soft.  Musculoskeletal:        General: Normal range of motion.     Cervical back: Normal range of motion.  Skin:    General: Skin is warm and dry.     Capillary Refill: Capillary refill takes less than 2 seconds.  Neurological:     General: No focal deficit present.     Mental Status: He is alert and oriented to person, place, and time. Mental status is at baseline.     Cranial Nerves: Cranial nerves 2-12 are intact.     Motor: Motor function is intact.     Coordination: Coordination is intact.     Gait: Gait is intact.     Deep Tendon Reflexes:     Reflex Scores:      Patellar reflexes are 2+ on the right side and 2+ on the left side. Psychiatric:        Mood and Affect: Mood normal.        Behavior: Behavior normal.         Thought Content: Thought content normal.        Judgment: Judgment normal.      No results found for any visits on 12/02/23.      The ASCVD Risk score (Arnett DK, et al., 2019) failed to calculate for the following reasons:   The  2019 ASCVD risk score is only valid for ages 13 to 35    Assessment & Plan:  Physical exam Assessment & Plan: Immunizations tetanus UTD. Influenza declines. PSA pending.  Discussed the importance of a healthy diet and regular exercise in order for weight loss, and to reduce the risk of further co-morbidity.  Exam stable. Labs pending.  Follow up in 1 year for repeat physical.    Morbid obesity (HCC) Assessment & Plan: Discussed weight loss options.  Considering GLP1 depending on lab results.   Discussed monitoring diet and starting exercise.  Orders: -     Hemoglobin A1c -     Lipid panel -     Comprehensive metabolic panel -     TSH  Hx of transient ischemic attack (TIA) Assessment & Plan: No concerns today.  Neuro exam within normal limits.  Unsure if the tingling on nose and third finger digit is a residual from TIAs in the past.   Checking labs to rule out other causes.   Hypogonadism in male Assessment & Plan: Chronic.  PSA level pending.   Continue Testosterone injections. Will request refill when needed.  Discussed at length regarding the risks for prostate cancer and the importance of prostate exams.   Referral placed for urology.   Orders: -     Ambulatory referral to Urology  Long-term current use of testosterone replacement therapy -     PSA  B12 deficiency Assessment & Plan: Low end of normal.  Continue OTC vitamin b 12.    Vitamin D deficiency Assessment & Plan: Continue vitamin d.  Will recheck in 12 weeks.      Return in about 4 weeks (around 12/30/2023) for numbness and tingling.[place] Charon, NP

## 2023-12-02 NOTE — Assessment & Plan Note (Signed)
 No concerns today.  Neuro exam within normal limits.  Unsure if the tingling on nose and third finger digit is a residual from TIAs in the past.   Checking labs to rule out other causes.

## 2023-12-02 NOTE — Patient Instructions (Addendum)
 Stop by the lab prior to leaving today. I will notify you of your results once received.   You will either be contacted via phone regarding your referral to urology , or you may receive a letter on your MyChart portal from our referral team with instructions for scheduling an appointment. Please let us know if you have not been contacted by anyone within two weeks.  Ill be in touch regarding the numbness and tingling.   See the written copy of this report in the patient's paper medical record.  These results did not interface directly into the electronic medical record and are summarized here.

## 2023-12-02 NOTE — Assessment & Plan Note (Signed)
 Immunizations tetanus UTD. Influenza declines. PSA pending.  Discussed the importance of a healthy diet and regular exercise in order for weight loss, and to reduce the risk of further co-morbidity.  Exam stable. Labs pending.  Follow up in 1 year for repeat physical.

## 2023-12-02 NOTE — Assessment & Plan Note (Signed)
 Low end of normal.  Continue OTC vitamin b 12.

## 2023-12-02 NOTE — Assessment & Plan Note (Signed)
 Discussed weight loss options.  Considering GLP1 depending on lab results.   Discussed monitoring diet and starting exercise.

## 2023-12-02 NOTE — Assessment & Plan Note (Signed)
 Continue vitamin d.  Will recheck in 12 weeks.

## 2023-12-03 ENCOUNTER — Encounter: Payer: Self-pay | Admitting: General Practice

## 2023-12-03 LAB — COMPREHENSIVE METABOLIC PANEL
ALT: 38 U/L (ref 0–53)
AST: 24 U/L (ref 0–37)
Albumin: 4.2 g/dL (ref 3.5–5.2)
Alkaline Phosphatase: 67 U/L (ref 39–117)
BUN: 11 mg/dL (ref 6–23)
CO2: 25 meq/L (ref 19–32)
Calcium: 9.2 mg/dL (ref 8.4–10.5)
Chloride: 103 meq/L (ref 96–112)
Creatinine, Ser: 0.95 mg/dL (ref 0.40–1.50)
GFR: 104.49 mL/min (ref >60.00)
Glucose, Bld: 81 mg/dL (ref 70–99)
Potassium: 4.3 meq/L (ref 3.5–5.1)
Sodium: 139 meq/L (ref 135–145)
Total Bilirubin: 0.6 mg/dL (ref 0.2–1.2)
Total Protein: 6.9 g/dL (ref 6.0–8.3)

## 2023-12-03 LAB — LIPID PANEL
Cholesterol: 141 mg/dL (ref 0–200)
HDL: 37.7 mg/dL — ABNORMAL LOW (ref >39.00)
LDL Cholesterol: 95 mg/dL (ref 0–99)
NonHDL: 103.36
Total CHOL/HDL Ratio: 4
Triglycerides: 44 mg/dL (ref 0.0–149.0)
VLDL: 8.8 mg/dL (ref 0.0–40.0)

## 2023-12-03 LAB — TSH: TSH: 1.14 u[IU]/mL (ref 0.35–5.50)

## 2023-12-03 LAB — PSA: PSA: 0.56 ng/mL (ref 0.10–4.00)

## 2023-12-03 LAB — HEMOGLOBIN A1C: Hgb A1c MFr Bld: 5.6 % (ref 4.6–6.5)

## 2023-12-03 MED ORDER — TESTOSTERONE CYPIONATE 200 MG/ML IM SOLN: 200.0000 mg | INTRAMUSCULAR | 0 refills | Status: DC

## 2024-01-20 ENCOUNTER — Other Ambulatory Visit: Payer: Self-pay | Admitting: General Practice

## 2024-01-20 DIAGNOSIS — E291 Testicular hypofunction: Secondary | ICD-10-CM

## 2024-01-20 MED ORDER — TESTOSTERONE CYPIONATE 200 MG/ML IM SOLN: 200.0000 mg | INTRAMUSCULAR | 0 refills | Status: DC

## 2024-01-20 NOTE — Telephone Encounter (Signed)
 LOV - 12/02/23 NOV - not scheduled RF - 12/23/23

## 2024-05-07 ENCOUNTER — Ambulatory Visit (INDEPENDENT_AMBULATORY_CARE_PROVIDER_SITE_OTHER): Admitting: Urology

## 2024-05-07 VITALS — BP 134/87 | HR 99 | Ht 74.0 in | Wt 395.0 lb

## 2024-05-07 DIAGNOSIS — E291 Testicular hypofunction: Secondary | ICD-10-CM | POA: Diagnosis not present

## 2024-05-07 MED ORDER — CLOMIPHENE CITRATE 50 MG PO TABS: 25.0000 mg | ORAL_TABLET | Freq: Every day | ORAL | 6 refills | Status: DC

## 2024-05-07 NOTE — Progress Notes (Signed)
 05/07/24 4:27 PM   Nicholas Long 22-Dec-1990 [id number]  CC: Hypogonadism  HPI: 33 year old male with morbid obesity BMI 51 referred for hypogonadism.  Reportedly has been on testosterone  replacement for at least a few years through PCP after having multiple TIAs, and only abnormality was low testosterone .  He does feel better with testosterone  injections and had been on 200 mg every 2 weeks with increased energy.  He does not have any children, unsure if he may want children in the future.  He has sleep apnea, noncompliant with CPAP.  He thinks his last testosterone  injection was around May 2025, reports decreased energy since that time.  On chart review, looks like I actually took his catheter out in July 2023 when he was having an appendectomy and they could not remove the Foley.  PMH: Past Medical History:  Diagnosis Date   Appendicitis 2023   Eustachian tube dysfunction 05/20/2014   Gallbladder & bile duct stone with obstruction 02/06/2008   Gynecomastia 05/20/2014   History of transient ischemic attack (TIA) 2023   Low testosterone     Sleep apnea    Does not wear C-pap due to mask leaking    Surgical History: Past Surgical History:  Procedure Laterality Date   APPENDECTOMY     CHOLECYSTECTOMY     CYSTOSCOPY  04/09/2022   Procedure: CYSTOSCOPY, REMOVAL OF URINARY CATHETER;  Surgeon: Francisca Redell BROCKS, MD;  Location: [facility];  Service: Urology;;   GALLBLADDER SURGERY  02/06/2008   LAPAROSCOPIC APPENDECTOMY N/A 04/09/2022   Procedure: APPENDECTOMY LAPAROSCOPIC;  Surgeon: Desiderio Schanz, MD;  Location: [facility];  Service: General;  Laterality: N/A;    Family History: Family History  Problem Relation Age of Onset   Depression Mother        Living   Diverticulosis Father    Pulmonary embolism Father    Hypertension Father    Bipolar disorder Sister    Cancer Sister        Deceased at Age 22   Bipolar disorder Maternal Grandmother    Hypertension Maternal  Grandfather    Stroke Maternal Grandfather    Ovarian cancer Paternal Grandmother    Diabetes Cousin        type 1    Social History:  reports that he has never smoked. He has never used smokeless tobacco. He reports current alcohol use of about 1.0 standard drink of alcohol per week. He reports that he does not currently use drugs.  Physical Exam: BP 134/87 (BP Location: Left Arm, Patient Position: Sitting, Cuff Size: Large)   Pulse 99   Ht 6' 2 (1.88 m)   Wt (!) 395 lb (179.2 kg)   SpO2 97%   BMI 50.71 kg/m    Constitutional:  Alert and oriented, No acute distress. Cardiovascular: No clubbing, cyanosis, or edema. Respiratory: Normal respiratory effort, no increased work of breathing. GI: Abdomen is soft, nontender, nondistended, no abdominal masses  Assessment & Plan:   33 year old male who has been on testosterone  200 mg injections every 2 weeks through PCP for low testosterone  with symptoms as well as TIA felt to potentially be related to low testosterone  levels.  We discussed the concept of exogenous testosterone  and fertility, and using shared decision making he was interested in a trial of Clomid .  Risks and benefits reviewed.  Trial of Clomid  25 mg daily RTC 6 to 8 weeks testosterone  and symptom check   Redell Francisca, MD 05/07/2024  [facility] [address], [address] [place], [place] [address] [phone]

## 2024-05-07 NOTE — Patient Instructions (Signed)
 Your medication has been sent to [facility]. This is an Therapist, occupational that offers medication at a significantly discounted price. You will need to go to the website (www.[email or web address]) to sign up for an account, give your address and enter your payment method.

## 2024-05-20 ENCOUNTER — Ambulatory Visit: Admitting: Urology

## 2024-06-09 ENCOUNTER — Other Ambulatory Visit

## 2024-06-09 DIAGNOSIS — E291 Testicular hypofunction: Secondary | ICD-10-CM

## 2024-06-10 LAB — TESTOSTERONE: Testosterone: 363 ng/dL (ref 264–916)

## 2024-06-24 ENCOUNTER — Ambulatory Visit: Admitting: Urology

## 2024-06-25 ENCOUNTER — Ambulatory Visit (INDEPENDENT_AMBULATORY_CARE_PROVIDER_SITE_OTHER): Admitting: Urology

## 2024-06-25 ENCOUNTER — Other Ambulatory Visit
Admission: RE | Admit: 2024-06-25 | Discharge: 2024-06-25 | Disposition: A | Discharge status: Home / Self Care | Attending: Urology | Admitting: Urology

## 2024-06-25 VITALS — BP 137/86 | HR 79 | Ht 74.0 in

## 2024-06-25 DIAGNOSIS — E291 Testicular hypofunction: Secondary | ICD-10-CM | POA: Insufficient documentation

## 2024-06-25 NOTE — Progress Notes (Signed)
   06/25/2024 1:50 PM   Nicholas Long 1990/12/23 [id number]  Reason for visit: Follow up hypogonadism  History: Morbid obesity, BMI greater than 50, was on testosterone  replacement through PCP(indication reportedly was low testosterone  with TIA).  He feels testosterone  improves his mood and energy.  Unsure about future pregnancies and opted to change to Clomid  July 2025 History of Foley catheter unable to be removed during appendectomy which required cystoscopy (Foley was inflated in prostatic urethra) Sleep apnea noncompliant with CPAP  Physical Exam: BP 137/86 (BP Location: Left Wrist, Patient Position: Sitting, Cuff Size: Large)   Pulse 79   Ht 6' 2 (1.88 m)   SpO2 97%   BMI 50.71 kg/m   Imaging/labs: Testosterone  improved to 363 on Clomid , slightly higher than prior levels on testosterone  injections  Today: He feels better than when he was off testosterone  injections, however does not feel quite as good as he did when he was on injections.  He is interested in what other options might be available  Plan:   Hypogonadism: Testosterone  levels in normal range.  We discussed conversion of testosterone  to estrogen and fat cells, and I recommended checking an estradiol  today.  We discussed potentially adding anastrozole to block conversion to estrogen pending those results.  We also discussed lifestyle strategies including weight loss, CPAP compliance Continue Clomid , call with estradiol  results   Nicholas JAYSON Burnet, MD  [facility] [address], [address] [place], [place] [address] [phone]

## 2024-06-26 LAB — ESTRADIOL: Estradiol: 56.8 pg/mL — ABNORMAL HIGH (ref 7.6–42.6)

## 2024-06-29 ENCOUNTER — Other Ambulatory Visit: Payer: Self-pay | Admitting: Urology

## 2024-06-29 ENCOUNTER — Ambulatory Visit: Payer: Self-pay | Admitting: Urology

## 2024-06-29 DIAGNOSIS — E291 Testicular hypofunction: Secondary | ICD-10-CM

## 2024-06-29 MED ORDER — ANASTROZOLE 1 MG PO TABS: 0.5000 mg | ORAL_TABLET | ORAL | 3 refills | Status: DC

## 2024-06-30 ENCOUNTER — Other Ambulatory Visit: Payer: Self-pay | Admitting: Urology

## 2024-06-30 MED ORDER — TESTOSTERONE CYPIONATE 200 MG/ML IM SOLN: 200.0000 mg | INTRAMUSCULAR | 1 refills | Status: AC

## 2024-06-30 NOTE — Progress Notes (Signed)
 Patient requested to change back to testosterone  cypionate 200 mg every 2 weeks and stop the Clomid .  RTC after 4 cycles with repeat testosterone  and estradiol  7 days after injection  Redell Burnet, MD 06/30/2024

## 2024-08-24 NOTE — Progress Notes (Signed)
 08/26/2024 7:48 PM   Nicholas Long Sep 30, 1991 [id number]  Referring provider: Vincente Shivers, NP [address] [place],  [place] [address]  Urological history: 1. Hypogonadism - testosterone  level pending  - estradiol  level pending - testosterone  cypionate 200 mg/mL, 1 cc every 14 days  Chief Complaint  Patient presents with   Hypogonadism   HPI: Nicholas Long is a 33 y.o. man who presents today for follow up.   Previous records reviewed.  He reports good adherence to testosterone  cypionate 200 mg/mL, 1 cc every 14 days.  .  Denies new complaints of low libido, erectile dysfunction, fatigue, or mood changes.  No complaints of gynecomastia, visual changes, or thromboembolic symptoms.  Energy level, libido and overall sense of wellbeing being reported as stable/ improved compared to prior visit.    Testosterone  level pending   Hemoglobin/hematocrit pending   Liver enzymes (11/2023) normal   I PSS 4/0  He reports urinary frequency and urinary intermittency.  Patient denies any modifying or aggravating factors.  Patient denies any recent UTI's, gross hematuria, dysuria or suprapubic/flank pain.  Patient denies any fevers, chills, nausea or vomiting.    PSA (11/2023) 0.56  Serum creatinine (11/2023) 5.6  SHIM 5  PMH: Past Medical History:  Diagnosis Date   Appendicitis 2023   Eustachian tube dysfunction 05/20/2014   Gallbladder & bile duct stone with obstruction 02/06/2008   Gynecomastia 05/20/2014   History of transient ischemic attack (TIA) 2023   Low testosterone     Sleep apnea    Does not wear C-pap due to mask leaking    Surgical History: Past Surgical History:  Procedure Laterality Date   APPENDECTOMY     CHOLECYSTECTOMY     CYSTOSCOPY  04/09/2022   Procedure: CYSTOSCOPY, REMOVAL OF URINARY CATHETER;  Surgeon: Francisca Redell BROCKS, MD;  Location: [facility];  Service: Urology;;   GALLBLADDER SURGERY  02/06/2008   LAPAROSCOPIC APPENDECTOMY  N/A 04/09/2022   Procedure: APPENDECTOMY LAPAROSCOPIC;  Surgeon: Desiderio Schanz, MD;  Location: [facility];  Service: General;  Laterality: N/A;    Home Medications:  Allergies as of 08/26/2024       Reactions   Escitalopram     Made jittery and caused sharp pain in head   Morphine And Codeine Other (See Comments), Dermatitis, Swelling   Burning in veins Other Reaction(s): Other (See Comments)        Medication List        Accurate as of August 26, 2024 11:59 PM. If you have any questions, ask your nurse or doctor.          aspirin-acetaminophen -caffeine 250-250-65 MG tablet Commonly known as: EXCEDRIN MIGRAINE Take 1 tablet by mouth every 6 (six) hours as needed for headache or migraine.   testosterone  cypionate 200 MG/ML injection Commonly known as: DEPOTESTOSTERONE CYPIONATE Inject 1 mL (200 mg total) into the muscle every 14 (fourteen) days.   VITAMIN B12 PO Take 5,000 mcg by mouth.   Vitamin D  (Ergocalciferol ) 1.25 MG (50000 UNIT) Caps capsule Commonly known as: DRISDOL  Take 1 capsule (50,000 Units total) by mouth every 7 (seven) days.   vitamin D3 50 MCG (2000 UT) Caps Take by mouth.   Wegovy  0.25 MG/0.5ML Soaj SQ injection Generic drug: semaglutide -weight management Inject 0.25 mg into the skin once a week.        Allergies:  Allergies  Allergen Reactions   Escitalopram      Made jittery and caused sharp pain in head   Morphine And  Codeine Other (See Comments), Dermatitis and Swelling    Burning in veins  Other Reaction(s): Other (See Comments)    Family History: Family History  Problem Relation Age of Onset   Depression Mother        Living   Diverticulosis Father    Pulmonary embolism Father    Hypertension Father    Bipolar disorder Sister    Cancer Sister        Deceased at Age 75   Bipolar disorder Maternal Grandmother    Hypertension Maternal Grandfather    Stroke Maternal Grandfather    Ovarian cancer Paternal Grandmother     Diabetes Cousin        type 1    Social History:  reports that he has never smoked. He has never used smokeless tobacco. He reports current alcohol use of about 1.0 standard drink of alcohol per week. He reports that he does not currently use drugs.  ROS: Pertinent ROS in HPI  Physical Exam: BP 129/86   Pulse 96   Ht 6' 2 (1.88 m)   Wt (!) 395 lb (179.2 kg)   BMI 50.71 kg/m   Constitutional:  Well nourished. Alert and oriented, No acute distress. HEENT: [place] AT, moist mucus membranes.  Trachea midline Cardiovascular: No clubbing, cyanosis, or edema. Respiratory: Normal respiratory effort, no increased work of breathing. Neurologic: Grossly intact, no focal deficits, moving all 4 extremities. Psychiatric: Normal mood and affect.  Laboratory Data: See Epic and HPI   I have reviewed the labs.   Pertinent Imaging: N/A  Assessment & Plan:    1. Hypogonadism  -testosterone  levels pending  -H & H pending  -continue testosterone  cypionate 200 mg/mL, 1 cc every 14 days  2. LUTS -PSA stable -continue conservative management, avoiding bladder irritants and timed voiding's  Return for Follow up pending labs.  These notes generated with voice recognition software. I apologize for typographical errors.  CLOTILDA HELON RIGGERS  [facility] [address]  [address] [place], [place] [address] [phone]

## 2024-08-26 ENCOUNTER — Ambulatory Visit: Admitting: Urology

## 2024-08-26 ENCOUNTER — Encounter: Payer: Self-pay | Admitting: Urology

## 2024-08-26 VITALS — BP 129/86 | HR 96 | Ht 74.0 in | Wt 395.0 lb

## 2024-08-26 DIAGNOSIS — E291 Testicular hypofunction: Secondary | ICD-10-CM | POA: Diagnosis not present

## 2024-08-26 DIAGNOSIS — R399 Unspecified symptoms and signs involving the genitourinary system: Secondary | ICD-10-CM

## 2024-08-27 ENCOUNTER — Ambulatory Visit: Payer: Self-pay | Admitting: Urology

## 2024-08-27 LAB — TESTOSTERONE: Testosterone: 401 ng/dL (ref 264–916)

## 2024-08-27 LAB — HEMOGLOBIN AND HEMATOCRIT, BLOOD
Hematocrit: 50.6 % (ref 37.5–51.0)
Hemoglobin: 16.8 g/dL (ref 13.0–17.7)

## 2024-08-27 LAB — ESTRADIOL: Estradiol: 67 pg/mL — ABNORMAL HIGH (ref 7.6–42.6)
